# Patient Record
Sex: Female | Born: 1995 | Race: White | Hispanic: No | Marital: Married | Smoking: Never smoker
Health system: Southern US, Community
[De-identification: ages and names within clinical notes are randomized; demographics above are authoritative.]

## PROBLEM LIST (undated history)

## (undated) DIAGNOSIS — K219 Gastro-esophageal reflux disease without esophagitis: Secondary | ICD-10-CM

## (undated) DIAGNOSIS — J45909 Unspecified asthma, uncomplicated: Secondary | ICD-10-CM

## (undated) DIAGNOSIS — F32A Depression, unspecified: Secondary | ICD-10-CM

## (undated) DIAGNOSIS — F419 Anxiety disorder, unspecified: Secondary | ICD-10-CM

## (undated) DIAGNOSIS — R55 Syncope and collapse: Secondary | ICD-10-CM

## (undated) DIAGNOSIS — G90A Postural orthostatic tachycardia syndrome (POTS): Secondary | ICD-10-CM

## (undated) DIAGNOSIS — Z8669 Personal history of other diseases of the nervous system and sense organs: Secondary | ICD-10-CM

## (undated) DIAGNOSIS — I951 Orthostatic hypotension: Secondary | ICD-10-CM

## (undated) DIAGNOSIS — R569 Unspecified convulsions: Secondary | ICD-10-CM

## (undated) DIAGNOSIS — F329 Major depressive disorder, single episode, unspecified: Secondary | ICD-10-CM

## (undated) DIAGNOSIS — I498 Other specified cardiac arrhythmias: Secondary | ICD-10-CM

## (undated) DIAGNOSIS — R000 Tachycardia, unspecified: Secondary | ICD-10-CM

## (undated) HISTORY — DX: Unspecified convulsions: R56.9

## (undated) HISTORY — DX: Depression, unspecified: F32.A

## (undated) HISTORY — DX: Gastro-esophageal reflux disease without esophagitis: K21.9

## (undated) HISTORY — DX: Personal history of other diseases of the nervous system and sense organs: Z86.69

## (undated) HISTORY — PX: WRIST SURGERY: SHX841

## (undated) HISTORY — DX: Major depressive disorder, single episode, unspecified: F32.9

## (undated) HISTORY — DX: Unspecified asthma, uncomplicated: J45.909

---
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
## 2014-11-02 ENCOUNTER — Ambulatory Visit: Payer: Self-pay | Admitting: Emergency Medicine

## 2014-11-21 ENCOUNTER — Emergency Department: Payer: Self-pay | Admitting: Student

## 2015-02-09 ENCOUNTER — Encounter: Payer: Self-pay | Admitting: General Practice

## 2015-02-09 ENCOUNTER — Emergency Department
Admission: EM | Admit: 2015-02-09 | Discharge: 2015-02-09 | Disposition: A | Discharge status: Home / Self Care | Payer: Managed Care, Other (non HMO) | Attending: Emergency Medicine | Admitting: Emergency Medicine

## 2015-02-09 ENCOUNTER — Other Ambulatory Visit: Payer: Self-pay

## 2015-02-09 DIAGNOSIS — R55 Syncope and collapse: Secondary | ICD-10-CM | POA: Insufficient documentation

## 2015-02-09 DIAGNOSIS — R509 Fever, unspecified: Secondary | ICD-10-CM | POA: Insufficient documentation

## 2015-02-09 HISTORY — DX: Syncope and collapse: R55

## 2015-02-09 LAB — URINALYSIS COMPLETE WITH MICROSCOPIC (ARMC ONLY)
Bilirubin Urine: NEGATIVE
GLUCOSE, UA: NEGATIVE mg/dL
HGB URINE DIPSTICK: NEGATIVE
Leukocytes, UA: NEGATIVE
NITRITE: NEGATIVE
PROTEIN: NEGATIVE mg/dL
RBC / HPF: NONE SEEN RBC/hpf (ref 0–5)
SPECIFIC GRAVITY, URINE: 1.006 (ref 1.005–1.030)
pH: 6 (ref 5.0–8.0)

## 2015-02-09 LAB — CBC
HCT: 40.9 % (ref 35.0–47.0)
HEMOGLOBIN: 13.6 g/dL (ref 12.0–16.0)
MCH: 30.6 pg (ref 26.0–34.0)
MCHC: 33.4 g/dL (ref 32.0–36.0)
MCV: 91.8 fL (ref 80.0–100.0)
PLATELETS: 348 10*3/uL (ref 150–440)
RBC: 4.45 MIL/uL (ref 3.80–5.20)
RDW: 12.8 % (ref 11.5–14.5)
WBC: 18.1 10*3/uL — ABNORMAL HIGH (ref 3.6–11.0)

## 2015-02-09 LAB — BASIC METABOLIC PANEL
ANION GAP: 10 (ref 5–15)
BUN: 11 mg/dL (ref 6–20)
CHLORIDE: 99 mmol/L — AB (ref 101–111)
CO2: 18 mmol/L — ABNORMAL LOW (ref 22–32)
Calcium: 8.1 mg/dL — ABNORMAL LOW (ref 8.9–10.3)
Creatinine, Ser: 1.07 mg/dL — ABNORMAL HIGH (ref 0.44–1.00)
GFR calc Af Amer: >60 mL/min (ref >60)
GLUCOSE: 101 mg/dL — AB (ref 65–99)
Potassium: 4.2 mmol/L (ref 3.5–5.1)
SODIUM: 127 mmol/L — AB (ref 135–145)

## 2015-02-09 MED ORDER — SODIUM CHLORIDE 0.9 % IV BOLUS (SEPSIS)
1000.0000 mL | Freq: Once | INTRAVENOUS | Status: AC
  Administered 2015-02-09: 1000 mL via INTRAVENOUS

## 2015-02-09 MED ORDER — IBUPROFEN 800 MG PO TABS
800.0000 mg | ORAL_TABLET | Freq: Once | ORAL | Status: AC
  Administered 2015-02-09: 800 mg via ORAL

## 2015-02-09 MED ORDER — PROPRANOLOL HCL 10 MG PO TABS: 10.0000 mg | ORAL_TABLET | Freq: Three times a day (TID) | ORAL | Status: DC | PRN

## 2015-02-09 MED ORDER — IBUPROFEN 800 MG PO TABS
ORAL_TABLET | ORAL | Status: AC
  Administered 2015-02-09: 800 mg via ORAL
  Filled 2015-02-09: qty 1

## 2015-02-09 MED ORDER — IBUPROFEN 800 MG PO TABS
ORAL_TABLET | ORAL | Status: AC
  Filled 2015-02-09: qty 1

## 2015-02-09 NOTE — ED Notes (Signed)
Patient unable to provide urine specimen at this time. Patient verbalized understanding to attempt urine specimen again.

## 2015-02-09 NOTE — ED Notes (Signed)

## 2015-02-09 NOTE — ED Notes (Signed)
Pt. Arrived to ed from home via ems. Reports that pt experienced syncope episode x two per pt. 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 Pt states "i woke up this morning just not feeling right". Pt reports that she has an apt in 2017 with a neurologist for these symptoms. Pt. Reports everytime she feels as If she is going to experiencing these syncope episode she experiences seizure like activities.  Alert and Oriented x 3 on arrival.

## 2015-02-09 NOTE — ED Notes (Signed)
MD at bedside. 

## 2015-02-09 NOTE — Discharge Instructions (Signed)
Syncope °Syncope means a person passes out (faints). The person usually wakes up in less than 5 minutes. It is important to seek medical care for syncope. °HOME CARE °· Have someone stay with you until you feel normal. °· Do not drive, use machines, or play sports until your doctor says it is okay. °· Keep all doctor visits as told. °· Lie down when you feel like you might pass out. Take deep breaths. Wait until you feel normal before standing up. °· Drink enough fluids to keep your pee (urine) clear or pale yellow. °· If you take blood pressure or heart medicine, get up slowly. Take several minutes to sit and then stand. °GET HELP RIGHT AWAY IF:  °· You have a severe headache. °· You have pain in the chest, belly (abdomen), or back. °· You are bleeding from the mouth or butt (rectum). °· You have black or tarry poop (stool). °· You have an irregular or very fast heartbeat. °· You have pain with breathing. °· You keep passing out, or you have shaking (seizures) when you pass out. °· You pass out when sitting or lying down. °· You feel confused. °· You have trouble walking. °· You have severe weakness. °· You have vision problems. °If you fainted, call your local emergency services (911 in [place] Do not drive yourself to the hospital. °MAKE SURE YOU:  °· Understand these instructions. °· Will watch your condition. °· Will get help right away if you are not doing well or get worse. °Document Released: 02/15/2008 Document Revised: 02/28/2012 Document Reviewed: 10/28/2011 °Exit[employer]® Patient Information ©2015 [employer]. This information is not intended to replace advice given to you by your health care provider. Make sure you discuss any questions you have with your health care provider. ° °

## 2015-02-09 NOTE — ED Provider Notes (Signed)
[facility] Emergency Department Provider Note  Time seen: 6:40 PM  I have reviewed the triage vital signs and the nursing notes.   HISTORY  Chief Complaint Near Syncope and Fever    HPI Sarah Harvey is a 19 y.o. female with no past medical history who presents the emergency department after a syncopal episode. According to the patient she has been feeling "bad" all day today. Her heart races at times. Patient was on the toilet urinating, when she became nauseated, got up and passed out. Patient has had frequent syncopal episodes since 2014 every several months per her parents. She has had a workup by cardiologist including tilt table testing and a seven-day Holter monitor without any abnormal findings. She has an appointment in January with an electrophysiologist for further workup. Today denies any chest pain, shortness of breath. Denies abdominal pain. Denies vaginal bleeding, bloody/black stool.    Past Medical History  Diagnosis Date  . Syncope     There are no active problems to display for this patient.   Past Surgical History  Procedure Laterality Date  . Wrist surgery      No current outpatient prescriptions on file.  Allergies Sulfa antibiotics  No family history on file.  Social History History  Substance Use Topics  . Smoking status: Never Smoker   . Smokeless tobacco: Never Used  . Alcohol Use: No    Review of Systems Constitutional: Negative for fever. Cardiovascular: Negative for chest pain. Respiratory: Negative for shortness of breath. Gastrointestinal: Negative for abdominal pain, vomiting and diarrhea. Positive for nausea. Genitourinary: Negative for dysuria. Neurological: Negative for headaches, focal weakness or numbness.  10-point ROS otherwise negative.  ____________________________________________   PHYSICAL EXAM:  VITAL SIGNS: ED Triage Vitals  Enc Vitals Group     BP 02/09/15 1738 101/50 mmHg    Pulse Rate 02/09/15 1738 110     Resp --      Temp 02/09/15 1738 98.7 F (37.1 C)     Temp Source 02/09/15 1738 Oral     SpO2 02/09/15 1738 100 %     Weight 02/09/15 1738 179 lb (81.194 kg)     Height 02/09/15 1738 5\' 4"  (1.626 m)     Head Cir --      Peak Flow --      Pain Score 02/09/15 1739 4     Pain Loc --      Pain Edu? --      Excl. in [place]? --     Constitutional: Alert and oriented. Well appearing and in no distress. Eyes: Normal exam ENT   Head: Normocephalic and atraumatic.   Mouth/Throat: Dry mucous membranes Cardiovascular: Normal rate, regular rhythm around 100 bpm. Respiratory: Normal respiratory effort without tachypnea nor retractions. Breath sounds are clear Gastrointestinal: Soft and nontender. No distention.   Musculoskeletal: Nontender with normal range of motion in all extremities. No lower extremity tenderness or edema. Neurologic:  Normal speech and language. No gross focal neurologic deficits  Skin:  Skin is warm, dry and intact.  Psychiatric: Mood and affect are normal. Speech and behavior are normal.   ____________________________________________    EKG  EKG reviewed and interpreted by myself shows sinus tachycardia 114 bpm, narrow QRS, normal axis, normal intervals, nonspecific but no concerning ST changes noted.    INITIAL IMPRESSION / ASSESSMENT AND PLAN / ED COURSE  Pertinent labs & imaging results that were available during my care of the patient were reviewed by me and  considered in my medical decision making (see chart for details).  Patient presents for syncopal episode. Patient has had many syncopal episodes in the past several years and has had a workup by a cardiologist. We will check labs, IV hydrate the patient. Patient states she feels her heart racing for several hours before she passes out. I discussed with the patient follow up with cardiology for a 30 day Holter monitor. She states while she was at a seven-day Holter monitor  she did not have any events. I also discussed the possibility of starting propranolol as needed when she feels her heart racing. The patient would like to do this. Currently patient appears very well, no medical complaints at this time besides some generalized weakness.   ----------------------------------------- 8:02 PM on 02/09/2015 -----------------------------------------  Patient's labs show a low sodium level, I discussed this with the patient. The patient states she has been told this by her cardiologist in the past. They recommended that she start drinking Gatorade and eating pretzels. She states she has been drinking free water. Mom states at times she will drink 6 glasses and one sitting.  I discussed this with the patient that she really needs to decrease her free water intake. Patient agreeable. Patient does have a mild fever 100.9. We will treat with ibuprofen. Currently awaiting urinalysis results.    Urinalysis is largely within normal limits besides slight ketones. Fevers come down. Unclear source of fever at this time, the patient denies headache, neck pain. She does state some mild congestion recently. ____________________________________________   FINAL CLINICAL IMPRESSION(S) / ED DIAGNOSES  Syncope Fever  Harvest Dark, MD 02/09/15 [phone]

## 2015-09-28 ENCOUNTER — Emergency Department
Admission: EM | Admit: 2015-09-28 | Discharge: 2015-09-28 | Disposition: A | Discharge status: Home / Self Care | Payer: Managed Care, Other (non HMO) | Attending: Emergency Medicine | Admitting: Emergency Medicine

## 2015-09-28 ENCOUNTER — Encounter: Payer: Self-pay | Admitting: Emergency Medicine

## 2015-09-28 DIAGNOSIS — F419 Anxiety disorder, unspecified: Secondary | ICD-10-CM | POA: Insufficient documentation

## 2015-09-28 DIAGNOSIS — Z3202 Encounter for pregnancy test, result negative: Secondary | ICD-10-CM | POA: Insufficient documentation

## 2015-09-28 DIAGNOSIS — Z79899 Other long term (current) drug therapy: Secondary | ICD-10-CM | POA: Diagnosis not present

## 2015-09-28 DIAGNOSIS — R252 Cramp and spasm: Secondary | ICD-10-CM | POA: Insufficient documentation

## 2015-09-28 DIAGNOSIS — R000 Tachycardia, unspecified: Secondary | ICD-10-CM | POA: Insufficient documentation

## 2015-09-28 DIAGNOSIS — R42 Dizziness and giddiness: Secondary | ICD-10-CM | POA: Insufficient documentation

## 2015-09-28 DIAGNOSIS — R55 Syncope and collapse: Secondary | ICD-10-CM | POA: Diagnosis not present

## 2015-09-28 DIAGNOSIS — R531 Weakness: Secondary | ICD-10-CM | POA: Diagnosis present

## 2015-09-28 LAB — CBC
HCT: 36.6 % (ref 35.0–47.0)
Hemoglobin: 12.3 g/dL (ref 12.0–16.0)
MCH: 30.4 pg (ref 26.0–34.0)
MCHC: 33.6 g/dL (ref 32.0–36.0)
MCV: 90.6 fL (ref 80.0–100.0)
Platelets: 349 10*3/uL (ref 150–440)
RBC: 4.04 MIL/uL (ref 3.80–5.20)
RDW: 12.7 % (ref 11.5–14.5)
WBC: 13.1 10*3/uL — AB (ref 3.6–11.0)

## 2015-09-28 LAB — BASIC METABOLIC PANEL
ANION GAP: 7 (ref 5–15)
BUN: 10 mg/dL (ref 6–20)
CALCIUM: 9 mg/dL (ref 8.9–10.3)
CO2: 25 mmol/L (ref 22–32)
Chloride: 107 mmol/L (ref 101–111)
Creatinine, Ser: 0.98 mg/dL (ref 0.44–1.00)
Glucose, Bld: 113 mg/dL — ABNORMAL HIGH (ref 65–99)
Potassium: 3.5 mmol/L (ref 3.5–5.1)
SODIUM: 139 mmol/L (ref 135–145)

## 2015-09-28 LAB — COMPREHENSIVE METABOLIC PANEL
ALBUMIN: 3.8 g/dL (ref 3.5–5.0)
ALK PHOS: 103 U/L (ref 38–126)
ALT: 16 U/L (ref 14–54)
ANION GAP: 7 (ref 5–15)
AST: 15 U/L (ref 15–41)
BILIRUBIN TOTAL: 0.4 mg/dL (ref 0.3–1.2)
BUN: 10 mg/dL (ref 6–20)
CALCIUM: 8.9 mg/dL (ref 8.9–10.3)
CO2: 25 mmol/L (ref 22–32)
Chloride: 105 mmol/L (ref 101–111)
Creatinine, Ser: 0.94 mg/dL (ref 0.44–1.00)
GFR calc Af Amer: >60 mL/min (ref >60)
GFR calc non Af Amer: >60 mL/min (ref >60)
GLUCOSE: 109 mg/dL — AB (ref 65–99)
Potassium: 3.6 mmol/L (ref 3.5–5.1)
Sodium: 137 mmol/L (ref 135–145)
TOTAL PROTEIN: 7 g/dL (ref 6.5–8.1)

## 2015-09-28 LAB — POCT PREGNANCY, URINE: Preg Test, Ur: NEGATIVE

## 2015-09-28 MED ORDER — ONDANSETRON HCL 4 MG/2ML IJ SOLN
4.0000 mg | Freq: Once | INTRAMUSCULAR | Status: AC
  Administered 2015-09-28: 4 mg via INTRAVENOUS
  Filled 2015-09-28: qty 2

## 2015-09-28 MED ORDER — SODIUM CHLORIDE 0.9 % IV BOLUS (SEPSIS)
1000.0000 mL | Freq: Once | INTRAVENOUS | Status: AC
  Administered 2015-09-28: 1000 mL via INTRAVENOUS

## 2015-09-28 MED ORDER — PENTAFLUOROPROP-TETRAFLUOROETH EX AERO
INHALATION_SPRAY | CUTANEOUS | Status: AC
  Filled 2015-09-28: qty 30

## 2015-09-28 MED ORDER — ONDANSETRON HCL 4 MG PO TABS: 4.0000 mg | ORAL_TABLET | Freq: Every day | ORAL | Status: DC | PRN

## 2015-09-28 NOTE — ED Notes (Addendum)
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

## 2015-09-28 NOTE — ED Provider Notes (Signed)
[facility] Emergency Department Provider Note  ____________________________________________  Time seen: Approximately 7:05 AM  I have reviewed the triage vital signs and the nursing notes.   HISTORY  Chief Complaint Nausea and Weakness    HPI Sarah Harvey is a 20 y.o. female with a history of vasovagal syncope who is presenting today with 3 episodes of near syncope and one episode of syncope. She said that about 1 AM she began having dry heaves followed by lightheadedness and everything going "fuzzy." She says that after the episode she does get cramping in her hands or feet which has happened in the past. She denies any chest pain or shortness of breath at any time. She says that she is also been feeling anxious because she is going back to college and starting volleyball in several days. She says that anxiety has precipitated these episodes in the past. She said that on her way to the exam room this morning she syncopized for about 30 seconds. No seizure activity was noticed at any point. She denies any pain at this time. She says that the preceding symptoms of lightheadedness, heart racing and stiffness in her hands and feet afterwards have all been present in the past. She sees Dr. Saralyn Pilar for cardiology and is scheduled to see an electrophysiologist on the 31st of June of this month. She says she possibly has a sick contact. However, denies diarrhea. Has had dry heaves in the past prior to her simple episodes as well.   Past Medical History  Diagnosis Date  . Syncope     There are no active problems to display for this patient.   Past Surgical History  Procedure Laterality Date  . Wrist surgery      Current Outpatient Rx  Name  Route  Sig  Dispense  Refill  . norethindrone-ethinyl estradiol (JUNEL FE,GILDESS FE,LOESTRIN FE) 1-20 MG-MCG tablet   Oral   Take 1 tablet by mouth daily.         . propranolol (INDERAL) 10 MG tablet   Oral  Take 1 tablet (10 mg total) by mouth every 8 (eight) hours as needed (fast heart rate).   20 tablet   0   . acetaminophen (TYLENOL) 500 MG tablet   Oral   Take 500 mg by mouth daily. Pt took for headache         . cefdinir (OMNICEF) 300 MG capsule   Oral   Take 300 mg by mouth 2 (two) times daily. Pt states took 875 mg twice a day for 7 days           Allergies Sulfa antibiotics  No family history on file.  Social History Social History  Substance Use Topics  . Smoking status: Never Smoker   . Smokeless tobacco: Never Used  . Alcohol Use: No    Review of Systems Constitutional: No fever/chills Eyes: No visual changes. ENT: No sore throat. Cardiovascular: Denies chest pain. Respiratory: Denies shortness of breath. Gastrointestinal: No abdominal pain.   No diarrhea.  No constipation. Genitourinary: Negative for dysuria. Musculoskeletal: Negative for back pain. Skin: Negative for rash. Neurological: Negative for headaches, focal weakness or numbness.  10-point ROS otherwise negative.  ____________________________________________   PHYSICAL EXAM:  VITAL SIGNS: ED Triage Vitals  Enc Vitals Group     BP 09/28/15 0652 116/82 mmHg     Pulse Rate 09/28/15 0652 88     Resp 09/28/15 0652 18     Temp 09/28/15 0652 98.1 F (  36.7 C)     Temp Source 09/28/15 0652 Oral     SpO2 09/28/15 0652 95 %     Weight 09/28/15 0652 170 lb (77.111 kg)     Height 09/28/15 0652 5\' 4"  (1.626 m)     Head Cir --      Peak Flow --      Pain Score 09/28/15 0644 4     Pain Loc --      Pain Edu? --      Excl. in [employer]? --     Constitutional: Alert and oriented. Well appearing and in no acute distress. Eyes: Conjunctivae are normal. PERRL. EOMI. Head: Atraumatic. Nose: No congestion/rhinnorhea. Mouth/Throat: Mucous membranes are moist.   Neck: No stridor.   Cardiovascular: Tachycardic, regular rhythm. Grossly normal heart sounds.  Good peripheral circulation. Respiratory: Normal  respiratory effort.  No retractions. Lungs CTAB. Gastrointestinal: Soft and nontender. No distention. No abdominal bruits. No CVA tenderness. Musculoskeletal: No lower extremity tenderness nor edema.  No joint effusions. Neurologic:  Normal speech and language. No gross focal neurologic deficits are appreciated. No gait instability. Skin:  Skin is warm, dry and intact. No rash noted. Psychiatric: Mood and affect are normal. Speech and behavior are normal.  ____________________________________________   LABS (all labs ordered are listed, but only abnormal results are displayed)  Labs Reviewed  BASIC METABOLIC PANEL - Abnormal; Notable for the following:    Glucose, Bld 113 (*)    All other components within normal limits  CBC - Abnormal; Notable for the following:    WBC 13.1 (*)    All other components within normal limits  COMPREHENSIVE METABOLIC PANEL - Abnormal; Notable for the following:    Glucose, Bld 109 (*)    All other components within normal limits  POC URINE PREG, ED  POCT PREGNANCY, URINE   ____________________________________________  EKG  ED ECG REPORT I, Schaevitz,  Youlanda Roys, the attending physician, personally viewed and interpreted this ECG.   Date: 09/28/2015  EKG Time: 6:51 AM  Rate: 89  Rhythm: normal sinus rhythm  Axis: Normal axis  Intervals:Short PR interval but without any obvious delta waves.  ST&T Change: No ST elevations or depressions. No abnormal T-wave inversions.  Some irregularity in the rhythm but likely respirophasic as the patient is an otherwise healthy 20 year old.  ____________________________________________  RADIOLOGY   ____________________________________________   PROCEDURES   ____________________________________________   INITIAL IMPRESSION / ASSESSMENT AND PLAN / ED COURSE  Pertinent labs & imaging results that were available during my care of the patient were reviewed by me and considered in my medical decision  making (see chart for details).  Episodes consistent with patient's past vasovagal syncope. Very low likelihood for PE. The patient is on birth control pills however she has no chest pain. No shortness of breath. Similar presentation and previous syncopal episodes. Patient also noted that she did not but one meal yesterday so that may be an element of dehydration as well. I will hydrate the patient and check basic labs.  ----------------------------------------- 10:41 AM on 09/28/2015 -----------------------------------------  Patient is resting comfortably at this time without any further episodes of vomiting or near syncope or syncope. Since heart rate is now 96 bpm on the monitor. She feels subjectively better as well. Likely ongoing syncopal episodes which were worsened by her dry heaving as well as anxiety. She has no appointment to follow up with her electrophysiologist later this month. She'll be discharged with Zofran. Encouraged to limit activity for the  rest of the day as well as eat and drink simple foods such as water, chicken broth and toes. The patient as well as the parents understand the plan and are willing to comply.  ____________________________________________   FINAL CLINICAL IMPRESSION(S) / ED DIAGNOSES  Vasovagal syncope.    Orbie Pyo, MD 09/28/15 1052

## 2015-09-28 NOTE — ED Notes (Signed)
IVF started and IV site noted infiltrated. Attempt by myself for new site x 3 without success. Medic request attempt.

## 2015-09-28 NOTE — Discharge Instructions (Signed)

## 2015-09-28 NOTE — ED Notes (Signed)
IV in [facility] due to multiple sticks and it being the only vein accessible.

## 2016-04-19 ENCOUNTER — Emergency Department: Payer: Managed Care, Other (non HMO)

## 2016-04-19 ENCOUNTER — Emergency Department
Admission: EM | Admit: 2016-04-19 | Discharge: 2016-04-20 | Disposition: A | Discharge status: Home / Self Care | Payer: Managed Care, Other (non HMO) | Attending: Emergency Medicine | Admitting: Emergency Medicine

## 2016-04-19 ENCOUNTER — Encounter: Payer: Self-pay | Admitting: Emergency Medicine

## 2016-04-19 DIAGNOSIS — R55 Syncope and collapse: Secondary | ICD-10-CM

## 2016-04-19 DIAGNOSIS — Z79899 Other long term (current) drug therapy: Secondary | ICD-10-CM | POA: Insufficient documentation

## 2016-04-19 DIAGNOSIS — R569 Unspecified convulsions: Secondary | ICD-10-CM | POA: Diagnosis present

## 2016-04-19 LAB — BASIC METABOLIC PANEL
ANION GAP: 8 (ref 5–15)
BUN: 12 mg/dL (ref 6–20)
CHLORIDE: 107 mmol/L (ref 101–111)
CO2: 24 mmol/L (ref 22–32)
Calcium: 9 mg/dL (ref 8.9–10.3)
Creatinine, Ser: 0.93 mg/dL (ref 0.44–1.00)
GFR calc Af Amer: >60 mL/min (ref >60)
GLUCOSE: 126 mg/dL — AB (ref 65–99)
POTASSIUM: 3.8 mmol/L (ref 3.5–5.1)
Sodium: 139 mmol/L (ref 135–145)

## 2016-04-19 LAB — CBC
HCT: 39.5 % (ref 35.0–47.0)
HEMOGLOBIN: 13.6 g/dL (ref 12.0–16.0)
MCH: 31.6 pg (ref 26.0–34.0)
MCHC: 34.5 g/dL (ref 32.0–36.0)
MCV: 91.7 fL (ref 80.0–100.0)
Platelets: 338 10*3/uL (ref 150–440)
RBC: 4.31 MIL/uL (ref 3.80–5.20)
RDW: 12.2 % (ref 11.5–14.5)
WBC: 7.5 10*3/uL (ref 3.6–11.0)

## 2016-04-19 LAB — POCT PREGNANCY, URINE: PREG TEST UR: NEGATIVE

## 2016-04-19 NOTE — ED Provider Notes (Signed)
[facility] Emergency Department Provider Note  Time seen: 9:17 PM  I have reviewed the triage vital signs and the nursing notes.   HISTORY  Chief Complaint Seizures    HPI Sarah Harvey is a 20 y.o. 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 The family states the patient has these episodes, which have been occurring with increasing frequency. They described the episode as the patient feeling like she is going to pass out, she lays on the floor, then starts shaking her head back and forth and putting her arms up in a contracted manner. States they're usually able to talk her through the episodes but tonight they were not able to talk her through the episode. 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 But has never been diagnosed with a seizure disorder. Per mom she is not sure the patient is passing out and jerking where she is having actual seizures. Her doctor has referred her for an EEG later this month to evaluate. At this time the patient's only complaint is a moderate frontal headache. Denies any focal weakness or numbness. Denies any chest pain now or at any time. Denies any lightheadedness currently.  Past Medical History:  Diagnosis Date  . Pott's disease   . Syncope     There are no active problems to display for this patient.   Past Surgical History:  Procedure Laterality Date  . WRIST SURGERY      Prior to Admission medications   Medication Sig Start Date End Date Taking? Authorizing Provider  acetaminophen (TYLENOL) 500 MG tablet Take 500 mg by mouth daily. Pt took for headache    Historical Provider, MD  cefdinir (OMNICEF) 300 MG capsule Take 300 mg by mouth 2 (two) times daily. Pt states took 875 mg twice a day for 7 days    Historical Provider, MD   norethindrone-ethinyl estradiol (JUNEL FE,GILDESS FE,LOESTRIN FE) 1-20 MG-MCG tablet Take 1 tablet by mouth daily.    Historical Provider, MD  ondansetron (ZOFRAN) 4 MG tablet Take 1 tablet (4 mg total) by mouth daily as needed for nausea or vomiting. 09/28/15   Orbie Pyo, MD  propranolol (INDERAL) 10 MG tablet Take 1 tablet (10 mg total) by mouth every 8 (eight) hours as needed (fast heart rate). 02/09/15   Harvest Dark, MD    Allergies  Allergen Reactions  . Sulfa Antibiotics Hives    No family history on file.  Social History Social History  Substance Use Topics  . Smoking status: Never Smoker  . Smokeless tobacco: Never Used  . Alcohol use No    Review of Systems Constitutional: Negative for fever. Cardiovascular: Negative for chest pain. Respiratory: Negative for shortness of breath. Gastrointestinal: Negative for abdominal pain, Genitourinary: Negative for dysuria. Musculoskeletal: Negative for back pain. Neurological: Moderate headache. Denies focal weakness or numbness. 10-point ROS otherwise negative.  ____________________________________________   PHYSICAL EXAM:  VITAL SIGNS: ED Triage Vitals  Enc Vitals Group     BP 04/19/16 2030 (!) 134/93     Pulse Rate 04/19/16 2030 93     Resp 04/19/16 2030 (!) 24     Temp 04/19/16 2016 98.2 F (36.8 C)     Temp Source 04/19/16 2016 Oral     SpO2 04/19/16 2030 99 %     Weight 04/19/16 2016 170 lb (77.1 kg)  Height 04/19/16 2016 5\' 4"  (1.626 m)     Head Circumference --      Peak Flow --      Pain Score 04/19/16 2016 7     Pain Loc --      Pain Edu? --      Excl. in [place]? --     Constitutional: Alert and oriented. Well appearing and in no distress. Eyes: Normal exam ENT   Head: Normocephalic and atraumatic   Mouth/Throat: Mucous membranes are moist. Cardiovascular: Normal rate, regular rhythm. No murmur Respiratory: Normal respiratory effort without tachypnea nor retractions. Breath  sounds are clear  Gastrointestinal: Soft and nontender. No distention.   Musculoskeletal: Nontender with normal range of motion in all extremities. Neurologic:  Normal speech and language. No gross focal neurologic deficits. Equal grip strength. 5/5 motor in all extremities. No pronator drift. Skin:  Skin is warm, dry and intact.  Psychiatric: Mood and affect are normal. Speech and behavior are normal.   ____________________________________________    EKG  EKG reviewed and interpreted by myself shows normal sinus rhythm at 93 bpm, narrow QRS, normal axis, normal intervals, no ST changes, normal EKG.  ____________________________________________    RADIOLOGY  CT head shows no acute abnormality.  ____________________________________________   INITIAL IMPRESSION / ASSESSMENT AND PLAN / ED COURSE  Pertinent labs & imaging results that were available during my care of the patient were reviewed by me and considered in my medical decision making (see chart for details).  The patient presents emergency Department seizure-like activity. They state his activity has been occurring at increasing frequency since January. Patient's symptoms appeared to be most consistent with syncope/near-syncope with hypoxic jerking. We will check labs, obtain a CT scan the head as the patient states a moderate headache with seizure-like activity. Patient has follow-up with her electrophysiologist, has an EEG scheduled for later this month as well as a tilt table test. We will IV hydrate well in the emergency department. Overall the patient appears very well with a normal neurologic exam.   CT head shows no acute abnormality, labs are largely within normal limits. BMP was faxed manually, is within normal limits. We'll discharge the patient home with PCP follow-up. I discussed strict return precautions, plenty of oral hydration and plenty of rest. ____________________________________________   FINAL CLINICAL  IMPRESSION(S) / ED DIAGNOSES  Syncope    Harvest Dark, MD 04/19/16 2307

## 2016-04-19 NOTE — ED Triage Notes (Signed)
Per [facility] patient comes from home. Hx of Potts disease. Patient had two seizures back to back. Patients neurologist is at [facility]. He has prescribed her salt packets to take. Patient had two today.  Patient normally has seizures when she is dehydrated. Per family, patient coughs a lot during seizures. Patient states this has been going on for 3 years. 1st seizure lasted 1 minute. 4 minutes afterwards patient had another seizure. EMS states it took the patient about 30 minutes to return to baseline. Now, patient c/o generalized weakness, nausea and HA. VSS. EMS gave 4 of Zofran.

## 2016-04-20 LAB — URINALYSIS COMPLETE WITH MICROSCOPIC (ARMC ONLY)
Bilirubin Urine: NEGATIVE
Glucose, UA: NEGATIVE mg/dL
KETONES UR: NEGATIVE mg/dL
Nitrite: NEGATIVE
PH: 8 (ref 5.0–8.0)
PROTEIN: NEGATIVE mg/dL
RBC / HPF: NONE SEEN RBC/hpf (ref 0–5)
Specific Gravity, Urine: 1.003 — ABNORMAL LOW (ref 1.005–1.030)

## 2016-05-29 ENCOUNTER — Emergency Department (HOSPITAL_COMMUNITY)
Admission: EM | Admit: 2016-05-29 | Discharge: 2016-05-29 | Disposition: A | Discharge status: Home / Self Care | Payer: Managed Care, Other (non HMO) | Attending: Emergency Medicine | Admitting: Emergency Medicine

## 2016-05-29 ENCOUNTER — Encounter (HOSPITAL_COMMUNITY): Payer: Self-pay | Admitting: Emergency Medicine

## 2016-05-29 DIAGNOSIS — I498 Other specified cardiac arrhythmias: Secondary | ICD-10-CM | POA: Insufficient documentation

## 2016-05-29 DIAGNOSIS — G90A Postural orthostatic tachycardia syndrome (POTS): Secondary | ICD-10-CM

## 2016-05-29 DIAGNOSIS — Z79899 Other long term (current) drug therapy: Secondary | ICD-10-CM | POA: Diagnosis not present

## 2016-05-29 DIAGNOSIS — L219 Seborrheic dermatitis, unspecified: Secondary | ICD-10-CM | POA: Diagnosis not present

## 2016-05-29 DIAGNOSIS — R000 Tachycardia, unspecified: Secondary | ICD-10-CM

## 2016-05-29 DIAGNOSIS — I951 Orthostatic hypotension: Secondary | ICD-10-CM

## 2016-05-29 DIAGNOSIS — R569 Unspecified convulsions: Secondary | ICD-10-CM | POA: Diagnosis present

## 2016-05-29 HISTORY — DX: Postural orthostatic tachycardia syndrome (POTS): G90.A

## 2016-05-29 HISTORY — DX: Tachycardia, unspecified: R00.0

## 2016-05-29 HISTORY — DX: Orthostatic hypotension: I95.1

## 2016-05-29 HISTORY — DX: Other specified cardiac arrhythmias: I49.8

## 2016-05-29 LAB — RAPID URINE DRUG SCREEN, HOSP PERFORMED
AMPHETAMINES: NOT DETECTED
BARBITURATES: NOT DETECTED
Benzodiazepines: POSITIVE — AB
Cocaine: NOT DETECTED
OPIATES: NOT DETECTED
TETRAHYDROCANNABINOL: NOT DETECTED

## 2016-05-29 LAB — URINALYSIS, ROUTINE W REFLEX MICROSCOPIC
BILIRUBIN URINE: NEGATIVE
GLUCOSE, UA: NEGATIVE mg/dL
Hgb urine dipstick: NEGATIVE
Ketones, ur: 15 mg/dL — AB
NITRITE: NEGATIVE
PH: 7.5 (ref 5.0–8.0)
Protein, ur: NEGATIVE mg/dL
SPECIFIC GRAVITY, URINE: 1.008 (ref 1.005–1.030)

## 2016-05-29 LAB — CBC WITH DIFFERENTIAL/PLATELET
Basophils Absolute: 0 10*3/uL (ref 0.0–0.1)
Basophils Relative: 0 %
EOS ABS: 0 10*3/uL (ref 0.0–0.7)
Eosinophils Relative: 0 %
HEMATOCRIT: 41.6 % (ref 36.0–46.0)
HEMOGLOBIN: 14 g/dL (ref 12.0–15.0)
LYMPHS ABS: 1.3 10*3/uL (ref 0.7–4.0)
LYMPHS PCT: 15 %
MCH: 31.1 pg (ref 26.0–34.0)
MCHC: 33.7 g/dL (ref 30.0–36.0)
MCV: 92.4 fL (ref 78.0–100.0)
MONOS PCT: 6 %
Monocytes Absolute: 0.5 10*3/uL (ref 0.1–1.0)
NEUTROS ABS: 7.3 10*3/uL (ref 1.7–7.7)
NEUTROS PCT: 79 %
Platelets: 411 10*3/uL — ABNORMAL HIGH (ref 150–400)
RBC: 4.5 MIL/uL (ref 3.87–5.11)
RDW: 12.5 % (ref 11.5–15.5)
WBC: 9.1 10*3/uL (ref 4.0–10.5)

## 2016-05-29 LAB — BASIC METABOLIC PANEL
ANION GAP: 4 — AB (ref 5–15)
BUN: 7 mg/dL (ref 6–20)
CHLORIDE: 111 mmol/L (ref 101–111)
CO2: 23 mmol/L (ref 22–32)
CREATININE: 0.76 mg/dL (ref 0.44–1.00)
Calcium: 9 mg/dL (ref 8.9–10.3)
GFR calc non Af Amer: >60 mL/min (ref >60)
Glucose, Bld: 98 mg/dL (ref 65–99)
POTASSIUM: 3.8 mmol/L (ref 3.5–5.1)
SODIUM: 138 mmol/L (ref 135–145)

## 2016-05-29 LAB — URINE MICROSCOPIC-ADD ON

## 2016-05-29 LAB — POC URINE PREG, ED: Preg Test, Ur: NEGATIVE

## 2016-05-29 MED ORDER — BUTALBITAL-APAP-CAFFEINE 50-325-40 MG PO TABS: 1.0000 | ORAL_TABLET | Freq: Four times a day (QID) | ORAL | 0 refills | Status: AC | PRN

## 2016-05-29 MED ORDER — TRIAMCINOLONE ACETONIDE 0.1 % EX CREA: 1.0000 "application " | TOPICAL_CREAM | Freq: Two times a day (BID) | CUTANEOUS | 0 refills | Status: DC

## 2016-05-29 MED ORDER — SODIUM CHLORIDE 0.9 % IV BOLUS (SEPSIS)
1000.0000 mL | Freq: Once | INTRAVENOUS | Status: AC
  Administered 2016-05-29: 1000 mL via INTRAVENOUS

## 2016-05-29 MED ORDER — KETOROLAC TROMETHAMINE 30 MG/ML IJ SOLN
30.0000 mg | Freq: Once | INTRAMUSCULAR | Status: AC
  Administered 2016-05-29: 30 mg via INTRAVENOUS
  Filled 2016-05-29: qty 1

## 2016-05-29 MED ORDER — IBUPROFEN 800 MG PO TABS: 800.0000 mg | ORAL_TABLET | Freq: Three times a day (TID) | ORAL | 0 refills | Status: DC
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
## 2016-05-29 NOTE — Discharge Instructions (Signed)
There were no significant abnormalities on your labs today. Follow-up with Dr. Sabra Heck, the electrophysiologist, as soon as possible. Ibuprofen or naproxen as needed for headaches. Should these medications fail to resolve the headache, try the Fioricet. Return to the ED as needed.  For the seborrheic dermatitis: Use the triamcinolone cream twice a day as needed to the affected areas.

## 2016-05-29 NOTE — ED Notes (Signed)
Care handoff to Kim RN 

## 2016-05-29 NOTE — Progress Notes (Signed)
CSW provided the pt and family with emotional support. CSW spoke with pt about her goals and things she wants to accomplish in life. CSW provided and discussed with pt resources on individual therapists in the area, [place] support group information, and [employer]. CSW encouraged the pt to also reach out to the counseling center on her college campus. Pt and family were happy to receive resources.  Lendon Collar, [place] ED Clinical Social Worker [phone]

## 2016-05-29 NOTE — ED Notes (Signed)
Lying  B/P 113/70 P. 82 Sitting  B/P 123/110 P 84 Standing  B/P 117/78  P 85

## 2016-05-29 NOTE — ED Notes (Signed)
Pt ambulatory to bathroom at this time.

## 2016-05-29 NOTE — ED Triage Notes (Signed)
PT brought to ED by EMS for seizure-like activity. PT has been diagnosed with POTS. PT takes salt supplements throughout the day. PT's mother reports they have been told they are not seizures, but POTS "episodes." PT reports a typical episode lasts 10 minutes. PT reports someone told her that this episode lasted 18 minutes prior to EMS arriving. EMS gave 2.5mg  of versed and it broke the activity.

## 2016-05-29 NOTE — ED Notes (Signed)
Sprite and graham crackers given to pt

## 2017-12-13 ENCOUNTER — Ambulatory Visit
Admission: EM | Admit: 2017-12-13 | Discharge: 2017-12-13 | Disposition: A | Discharge status: Home / Self Care | Payer: Commercial Managed Care - PPO | Attending: Family Medicine | Admitting: Family Medicine

## 2017-12-13 ENCOUNTER — Encounter: Payer: Self-pay | Admitting: Emergency Medicine

## 2017-12-13 DIAGNOSIS — J0100 Acute maxillary sinusitis, unspecified: Secondary | ICD-10-CM | POA: Diagnosis not present

## 2017-12-13 DIAGNOSIS — R05 Cough: Secondary | ICD-10-CM

## 2017-12-13 DIAGNOSIS — R059 Cough, unspecified: Secondary | ICD-10-CM

## 2017-12-13 MED ORDER — BENZONATATE 100 MG PO CAPS: 100.0000 mg | ORAL_CAPSULE | Freq: Three times a day (TID) | ORAL | 0 refills | Status: DC | PRN

## 2017-12-13 MED ORDER — ALBUTEROL SULFATE HFA 108 (90 BASE) MCG/ACT IN AERS: 2.0000 | INHALATION_SPRAY | RESPIRATORY_TRACT | 0 refills | Status: DC | PRN

## 2017-12-13 MED ORDER — AMOXICILLIN-POT CLAVULANATE 875-125 MG PO TABS: 1.0000 | ORAL_TABLET | Freq: Two times a day (BID) | ORAL | 0 refills | Status: DC

## 2017-12-13 NOTE — ED Provider Notes (Addendum)
[facility] ____________________________________________  Time seen: Approximately 12:18 PM  I have reviewed the triage vital signs and the nursing notes.   HISTORY  Chief Complaint Cough (APPOINTMENT)   HPI Sarah Harvey is a 22 y.o. female   present with mother at bedside for evaluation of 3 weeks of cough and congestion symptoms.  States initially felt like it was just a cold, reporting multiple others in household with similar complaints, however hers has lingered.  States over the last 1 week she has had gradual increased sinus pressure and sinus congestion with continued coughing.  States last week she did have a fever, subjective, not measured.  Denies any fever or fever-like symptoms in the last few days.  States cough is overall a hacking nonproductive cough.  States began developing green thick nasal drainage with onset of sinus pressure.  Reports continues to eat and drink well.  Unrelieved with multiple over-the-counter congestion medications including TheraFlu.  States that she does pay attention particularly to what she takes over-the-counter due to pots syndrome.  Denies any issues regarding her pots syndrome or syncope.  Reports otherwise feels well.  Denies other aggravating factors. Denies chest pain, shortness of breath, abdominal pain, extremity pain, extremity swelling or rash. Denies recent sickness. Denies recent antibiotic use.    Tresea Mall, MD: PCP  Patient's last menstrual period was 11/22/2017 (approximate).Denies pregnancy.   Past Medical History:  Diagnosis Date  . POTS (postural orthostatic tachycardia syndrome)   . Syncope     There are no active problems to display for this patient.   Past Surgical History:  Procedure Laterality Date  . WRIST SURGERY       No current facility-administered medications for this encounter.   Current Outpatient Medications:  .  cetirizine (ZYRTEC ALLERGY) 10 MG tablet, Take 10 mg by mouth  daily., Disp: , Rfl:  .  norethindrone-ethinyl estradiol (JUNEL FE,GILDESS FE,LOESTRIN FE) 1-20 MG-MCG tablet, Take 1 tablet by mouth daily., Disp: , Rfl:  .  acetaminophen (TYLENOL) 500 MG tablet, Take 500 mg by mouth daily. Pt took for headache, Disp: , Rfl:  .  albuterol (PROVENTIL HFA;VENTOLIN HFA) 108 (90 Base) MCG/ACT inhaler, Inhale 2 puffs into the lungs every 4 (four) hours as needed for wheezing., Disp: 1 Inhaler, Rfl: 0 .  amoxicillin-clavulanate (AUGMENTIN) 875-125 MG tablet, Take 1 tablet by mouth every 12 (twelve) hours., Disp: 20 tablet, Rfl: 0 .  benzonatate (TESSALON PERLES) 100 MG capsule, Take 1 capsule (100 mg total) by mouth 3 (three) times daily as needed for cough., Disp: 15 capsule, Rfl: 0 .  ibuprofen (ADVIL,MOTRIN) 800 MG tablet, Take 1 tablet (800 mg total) by mouth 3 (three) times daily., Disp: 21 tablet, Rfl: 0 .  ondansetron (ZOFRAN) 4 MG tablet, Take 1 tablet (4 mg total) by mouth daily as needed for nausea or vomiting., Disp: 10 tablet, Rfl: 0  Allergies Sulfa antibiotics  History reviewed. No pertinent family history.  Social History Social History   Tobacco Use  . Smoking status: Never Smoker  . Smokeless tobacco: Never Used  Substance Use Topics  . Alcohol use: Yes    Comment: every now and then  . Drug use: No    Review of Systems Constitutional: As above.  ENT: No sore throat. Cardiovascular: Denies chest pain. Respiratory: Denies shortness of breath. Gastrointestinal: No abdominal pain.  Musculoskeletal: Negative for back pain. Skin: Negative for rash.   ____________________________________________   PHYSICAL EXAM:  VITAL SIGNS: ED Triage Vitals  Enc  Vitals Group     BP 12/13/17 1210 90/69     Pulse Rate 12/13/17 1210 89     Resp 12/13/17 1210 16     Temp 12/13/17 1210 98.2 F (36.8 C)     Temp Source 12/13/17 1210 Oral     SpO2 12/13/17 1210 98 %     Weight 12/13/17 1207 180 lb (81.6 kg)     Height 12/13/17 1207 5\' 3"  (1.6 m)       Head Circumference --      Peak Flow --      Pain Score 12/13/17 1207 7     Pain Loc --      Pain Edu? --      Excl. in [place]? --    Constitutional: Alert and oriented. Well appearing and in no acute distress. Eyes: Conjunctivae are normal.  Head: Atraumatic.Mild to moderate tenderness to palpation bilateral frontal and maxillary sinuses. No swelling. No erythema.   Ears: no erythema, normal TMs bilaterally.   Nose: nasal congestion with bilateral nasal turbinate erythema and edema.   Mouth/Throat: Mucous membranes are moist.  Oropharynx non-erythematous.No tonsillar swelling or exudate.  Neck: No stridor.  No cervical spine tenderness to palpation. Hematological/Lymphatic/Immunilogical: No cervical lymphadenopathy. Cardiovascular: Normal rate, regular rhythm. Grossly normal heart sounds.  Good peripheral circulation. Respiratory: Normal respiratory effort.  No retractions. No wheezes, rales or rhonchi. Good air movement. Dry intermittent cough noted in room with mild bronchospasm.  Speaks in complete sentences. Musculoskeletal: No cervical, thoracic or lumbar tenderness to palpation.  Neurologic:  Normal speech and language. No gait instability. Skin:  Skin is warm, dry and intact. No rash noted.  Psychiatric: Mood and affect are normal. Speech and behavior are normal.  ___________________________________________   LABS (all labs ordered are listed, but only abnormal results are displayed)  Labs Reviewed - No data to display   PROCEDURES Procedures    INITIAL IMPRESSION / ASSESSMENT AND PLAN / ED COURSE  Pertinent labs & imaging results that were available during my care of the patient were reviewed by me and considered in my medical decision making (see chart for details).  Well-appearing patient.  No acute distress.  Suspect recent viral upper respiratory infection, secondary sinusitis.  Will treat patient with oral Augmentin and PRN Tessalon Perles, albuterol inhaler as  needed.  Encourage rest, fluids, supportive care.  Discussed follow up with Primary care physician this week. Discussed follow up and return parameters including no resolution or any worsening concerns. Patient verbalized understanding and agreed to plan.   ____________________________________________   FINAL CLINICAL IMPRESSION(S) / ED DIAGNOSES  Final diagnoses:  Acute maxillary sinusitis, recurrence not specified  Cough     ED Discharge Orders        Ordered    amoxicillin-clavulanate (AUGMENTIN) 875-125 MG tablet  Every 12 hours     12/13/17 1224    benzonatate (TESSALON PERLES) 100 MG capsule  3 times daily PRN     12/13/17 1224    albuterol (PROVENTIL HFA;VENTOLIN HFA) 108 (90 Base) MCG/ACT inhaler  Every 4 hours PRN     12/13/17 1224       Note: This dictation was prepared with Dragon dictation along with smaller phrase technology. Any transcriptional errors that result from this process are unintentional.         Marylene Land, NP 12/13/17 [place], [place], NP 12/13/17 1650

## 2017-12-13 NOTE — ED Triage Notes (Signed)
Patient c/o cough and chest congestion for 3 weeks.   

## 2017-12-13 NOTE — Discharge Instructions (Addendum)
Take medication as prescribed. Rest. Drink plenty of fluids.  ° °Follow up with your primary care physician this week as needed. Return to Urgent care for new or worsening concerns.  ° °

## 2018-02-16 ENCOUNTER — Ambulatory Visit: Payer: Managed Care, Other (non HMO) | Admitting: Internal Medicine

## 2018-03-20 ENCOUNTER — Ambulatory Visit: Payer: Commercial Managed Care - PPO | Admitting: Internal Medicine

## 2018-05-10 ENCOUNTER — Telehealth: Payer: Self-pay

## 2018-05-10 NOTE — Telephone Encounter (Signed)
Copied from [place] [id number]. Topic: Inquiry >> May 10, 2018 11:41 AM Conception Chancy, NT wrote: Reason for CRM: patient mother is calling her name is Sarah Harvey, she states that all her family sees Dr. Nicki Reaper and Dr. Nicki Reaper said that she would accept her daughter as a new patient. Please confirm and contact to schedule. It is okay to contact the mother to schedule since she is at college.

## 2018-05-10 NOTE — Telephone Encounter (Signed)
msg sent to provider 

## 2018-05-15 ENCOUNTER — Telehealth: Payer: Self-pay

## 2018-05-15 NOTE — Telephone Encounter (Signed)
Copied from [place] [phone]. Topic: Inquiry >> May 10, 2018 11:41 AM Conception Chancy, NT wrote: Reason for CRM: patient mother is calling her name is Sarah Harvey, she states that all her family sees Dr. Nicki Reaper and Dr. Nicki Reaper said that she would accept her daughter as a new patient. Please confirm and contact to schedule. It is okay to contact the mother to schedule since she is at college. >> May 15, 2018 12:47 PM Sheran Luz wrote: Pt's mother called back inquiring about if Dr Nicki Reaper would accept her daughter and when they could get that scheduled.

## 2018-05-15 NOTE — Telephone Encounter (Signed)
Lm to call back  and schedule

## 2018-07-13 ENCOUNTER — Ambulatory Visit
Admission: EM | Admit: 2018-07-13 | Discharge: 2018-07-13 | Disposition: A | Discharge status: Home / Self Care | Payer: Commercial Managed Care - PPO | Attending: Family Medicine | Admitting: Family Medicine

## 2018-07-13 DIAGNOSIS — R197 Diarrhea, unspecified: Secondary | ICD-10-CM

## 2018-07-13 DIAGNOSIS — R110 Nausea: Secondary | ICD-10-CM

## 2018-07-13 DIAGNOSIS — R509 Fever, unspecified: Secondary | ICD-10-CM | POA: Diagnosis not present

## 2018-07-13 DIAGNOSIS — J029 Acute pharyngitis, unspecified: Secondary | ICD-10-CM | POA: Diagnosis not present

## 2018-07-13 DIAGNOSIS — J020 Streptococcal pharyngitis: Secondary | ICD-10-CM | POA: Diagnosis not present

## 2018-07-13 LAB — RAPID INFLUENZA A&B ANTIGENS
Influenza A (ARMC): NEGATIVE
Influenza B (ARMC): NEGATIVE

## 2018-07-13 LAB — RAPID STREP SCREEN (MED CTR MEBANE ONLY): STREPTOCOCCUS, GROUP A SCREEN (DIRECT): POSITIVE — AB

## 2018-07-13 MED ORDER — PENICILLIN V POTASSIUM 500 MG PO TABS: 500.0000 mg | ORAL_TABLET | Freq: Three times a day (TID) | ORAL | 0 refills | Status: DC

## 2018-07-13 MED ORDER — ACETAMINOPHEN 500 MG PO TABS
1000.0000 mg | ORAL_TABLET | Freq: Once | ORAL | Status: AC
  Administered 2018-07-13: 1000 mg via ORAL

## 2018-07-13 MED ORDER — LIDOCAINE VISCOUS HCL 2 % MT SOLN: OROMUCOSAL | 0 refills | Status: DC

## 2018-07-13 MED ORDER — ONDANSETRON 8 MG PO TBDP: 8.0000 mg | ORAL_TABLET | Freq: Three times a day (TID) | ORAL | 0 refills | Status: DC | PRN

## 2018-07-13 NOTE — ED Notes (Signed)
Pt to room via [place].  HR in 130s.  Pt with red, inflamed throat.  No known sick contacts.

## 2018-07-13 NOTE — ED Notes (Signed)
Pt in treatment room.  In NAD.  Needs address.  Pt/Fam updated on POC.   

## 2018-07-13 NOTE — ED Provider Notes (Signed)
[facility]    CSN: [id number] Arrival date & time: 07/13/18  0834     History   Chief Complaint Chief Complaint  Patient presents with  . Fever  . Sore Throat    HPI Sarah Harvey is a 22 y.o. female.   The history is provided by the patient.  Fever  Temp source:  Subjective Severity:  Moderate Onset quality:  Sudden Duration:  5 days Timing:  Constant Chronicity:  New Associated symptoms: congestion, diarrhea (loose stools), nausea, rhinorrhea and sore throat   Associated symptoms: no chest pain and no headaches   Risk factors: sick contacts   Risk factors: no contaminated food, no contaminated water, no immunosuppression, no occupational exposure and no recent travel   Sore Throat  This is a new problem. The current episode started more than 2 days ago. The problem occurs constantly. The problem has been gradually worsening. Pertinent negatives include no chest pain, no abdominal pain, no headaches and no shortness of breath. She has tried nothing for the symptoms.    Past Medical History:  Diagnosis Date  . POTS (postural orthostatic tachycardia syndrome)   . Syncope     There are no active problems to display for this patient.   Past Surgical History:  Procedure Laterality Date  . WRIST SURGERY      OB History   None      Home Medications    Prior to Admission medications   Medication Sig Start Date End Date Taking? Authorizing Provider  acetaminophen (TYLENOL) 500 MG tablet Take 500 mg by mouth daily. Pt took for headache   Yes [provider]  norethindrone-ethinyl estradiol (JUNEL FE,GILDESS FE,LOESTRIN FE) 1-20 MG-MCG tablet Take 1 tablet by mouth daily.   Yes [provider]  albuterol (PROVENTIL HFA;VENTOLIN HFA) 108 (90 Base) MCG/ACT inhaler Inhale 2 puffs into the lungs every 4 (four) hours as needed for wheezing. 12/13/17   Marylene Land, NP  amoxicillin-clavulanate (AUGMENTIN) 875-125 MG tablet Take 1  tablet by mouth every 12 (twelve) hours. 12/13/17   Marylene Land, NP  benzonatate (TESSALON PERLES) 100 MG capsule Take 1 capsule (100 mg total) by mouth 3 (three) times daily as needed for cough. 12/13/17   Marylene Land, NP  cetirizine (ZYRTEC ALLERGY) 10 MG tablet Take 10 mg by mouth daily.    [provider]  ibuprofen (ADVIL,MOTRIN) 800 MG tablet Take 1 tablet (800 mg total) by mouth 3 (three) times daily. 05/29/16   Joy, Shawn C, PA-C  lidocaine (XYLOCAINE) 2 % solution 20 ml gargle and spit q 6 hours prn 07/13/18   Norval Gable, MD  ondansetron (ZOFRAN ODT) 8 MG disintegrating tablet Take 1 tablet (8 mg total) by mouth every 8 (eight) hours as needed. 07/13/18   Norval Gable, MD  ondansetron (ZOFRAN) 4 MG tablet Take 1 tablet (4 mg total) by mouth daily as needed for nausea or vomiting. 09/28/15   Schaevitz, Randall An, MD  penicillin v potassium (VEETID) 500 MG tablet Take 1 tablet (500 mg total) by mouth 3 (three) times daily. 07/13/18   Norval Gable, MD    Family History History reviewed. No pertinent family history.  Social History Social History   Tobacco Use  . Smoking status: Never Smoker  . Smokeless tobacco: Never Used  Substance Use Topics  . Alcohol use: Yes    Comment: every now and then  . Drug use: No     Allergies   Sulfa antibiotics   Review  of Systems Review of Systems  Constitutional: Positive for fever.  HENT: Positive for congestion, rhinorrhea and sore throat.   Respiratory: Negative for shortness of breath.   Cardiovascular: Negative for chest pain.  Gastrointestinal: Positive for diarrhea (loose stools) and nausea. Negative for abdominal pain.  Neurological: Negative for headaches.     Physical Exam Triage Vital Signs ED Triage Vitals  Enc Vitals Group     BP 07/13/18 0849 (!) 133/94     Pulse Rate 07/13/18 0849 (!) 132     Resp 07/13/18 0849 18     Temp 07/13/18 0849 (!) 100.5 F (38.1 C)     Temp Source 07/13/18 0849 Oral      SpO2 07/13/18 0849 99 %     Weight --      Height --      Head Circumference --      Peak Flow --      Pain Score 07/13/18 0851 9     Pain Loc --      Pain Edu? --      Excl. in [place]? --    No data found.  Updated Vital Signs BP (!) 133/94 (BP Location: Right Arm)   Pulse (!) 132   Temp (!) 100.5 F (38.1 C) (Oral)   Resp 18   LMP 06/20/2018   SpO2 99%   Visual Acuity Right Eye Distance:   Left Eye Distance:   Bilateral Distance:    Right Eye Near:   Left Eye Near:    Bilateral Near:     Physical Exam  Constitutional: She appears well-developed and well-nourished. No distress.  HENT:  Head: Normocephalic and atraumatic.  Nose: Mucosal edema and rhinorrhea present. No nose lacerations, sinus tenderness, nasal deformity, septal deviation or nasal septal hematoma. No epistaxis.  No foreign bodies. Right sinus exhibits no maxillary sinus tenderness and no frontal sinus tenderness. Left sinus exhibits no maxillary sinus tenderness and no frontal sinus tenderness.  Mouth/Throat: Uvula is midline and mucous membranes are normal. Posterior oropharyngeal erythema present. No oropharyngeal exudate, posterior oropharyngeal edema or tonsillar abscesses. Tonsillar exudate.  Eyes: Pupils are equal, round, and reactive to light. Conjunctivae and EOM are normal. Right eye exhibits no discharge. Left eye exhibits no discharge. No scleral icterus.  Neck: Normal range of motion. Neck supple. No thyromegaly present.  Cardiovascular: Normal rate, regular rhythm and normal heart sounds.  Pulmonary/Chest: Effort normal and breath sounds normal. No respiratory distress. She has no wheezes. She has no rales.  Lymphadenopathy:    She has no cervical adenopathy.  Skin: She is not diaphoretic.  Nursing note and vitals reviewed.    UC Treatments / Results  Labs (all labs ordered are listed, but only abnormal results are displayed) Labs Reviewed  RAPID STREP SCREEN ([facility] ONLY) -  Abnormal; Notable for the following components:      Result Value   Streptococcus, Group A Screen (Direct) POSITIVE (*)    All other components within normal limits  RAPID INFLUENZA A&B ANTIGENS ([facility] ONLY)    EKG None  Radiology No results found.  Procedures Procedures (including critical care time)  Medications Ordered in UC Medications  acetaminophen (TYLENOL) tablet 1,000 mg (1,000 mg Oral Given 07/13/18 0921)    Initial Impression / Assessment and Plan / UC Course  I have reviewed the triage vital signs and the nursing notes.  Pertinent labs & imaging results that were available during my care of the patient were reviewed by me and  considered in my medical decision making (see chart for details).      Final Clinical Impressions(s) / UC Diagnoses   Final diagnoses:  Streptococcal sore throat    ED Prescriptions    Medication Sig Dispense Auth. Provider   ondansetron (ZOFRAN ODT) 8 MG disintegrating tablet Take 1 tablet (8 mg total) by mouth every 8 (eight) hours as needed. 10 tablet Norval Gable, MD   penicillin v potassium (VEETID) 500 MG tablet Take 1 tablet (500 mg total) by mouth 3 (three) times daily. 30 tablet [place]ia Stanger, Linward Foster, MD   lidocaine (XYLOCAINE) 2 % solution 20 ml gargle and spit q 6 hours prn 100 mL Norval Gable, MD     1. Lab results and diagnosis reviewed with patient 2. rx as per orders above; reviewed possible side effects, interactions, risks and benefits  3. Recommend supportive treatment with rest, fluids, otc analgesics prn 4. Follow-up prn if symptoms worsen or don't improve   Controlled Substance Prescriptions [place] Controlled Substance Registry consulted? Not Applicable   Norval Gable, MD 07/13/18 1230

## 2018-07-13 NOTE — ED Triage Notes (Signed)
Sore throat and fever for a week.  Emesis and diarrhea "just started".  Pt reporting POTS.  WC to room.

## 2018-08-15 ENCOUNTER — Ambulatory Visit: Payer: Commercial Managed Care - PPO | Admitting: Internal Medicine

## 2018-08-28 ENCOUNTER — Telehealth: Payer: Self-pay

## 2018-08-28 NOTE — Telephone Encounter (Signed)
Copied from [place] [id number]. Topic: Appointment Scheduling - Scheduling Inquiry for Clinic >> Aug 28, 2018 12:33 PM Cecelia Byars, [place] wrote: Reason for CRM Patients mom called to reschedule her appointment with please call her at  Dr Nicki Reaper   please call her at [phone]

## 2018-08-30 NOTE — Telephone Encounter (Signed)
New pt appt rescheduled and advised to arrive 30 minutes early for appt with paper work completed

## 2018-12-11 ENCOUNTER — Other Ambulatory Visit: Payer: Self-pay

## 2018-12-11 ENCOUNTER — Ambulatory Visit: Payer: Commercial Managed Care - PPO | Admitting: Internal Medicine

## 2018-12-11 ENCOUNTER — Encounter: Payer: Self-pay | Admitting: Internal Medicine

## 2018-12-11 DIAGNOSIS — R5383 Other fatigue: Secondary | ICD-10-CM | POA: Insufficient documentation

## 2018-12-11 DIAGNOSIS — F32A Depression, unspecified: Secondary | ICD-10-CM

## 2018-12-11 DIAGNOSIS — Z8669 Personal history of other diseases of the nervous system and sense organs: Secondary | ICD-10-CM | POA: Diagnosis not present

## 2018-12-11 DIAGNOSIS — J45909 Unspecified asthma, uncomplicated: Secondary | ICD-10-CM | POA: Diagnosis not present

## 2018-12-11 DIAGNOSIS — R000 Tachycardia, unspecified: Secondary | ICD-10-CM | POA: Diagnosis not present

## 2018-12-11 DIAGNOSIS — G90A Postural orthostatic tachycardia syndrome (POTS): Secondary | ICD-10-CM

## 2018-12-11 DIAGNOSIS — K219 Gastro-esophageal reflux disease without esophagitis: Secondary | ICD-10-CM

## 2018-12-11 DIAGNOSIS — I951 Orthostatic hypotension: Secondary | ICD-10-CM

## 2018-12-11 DIAGNOSIS — F320 Major depressive disorder, single episode, mild: Secondary | ICD-10-CM

## 2018-12-11 LAB — HEPATIC FUNCTION PANEL
ALBUMIN: 3.8 g/dL (ref 3.5–5.2)
ALK PHOS: 105 U/L (ref 39–117)
ALT: 25 U/L (ref 0–35)
AST: 19 U/L (ref 0–37)
BILIRUBIN TOTAL: 0.3 mg/dL (ref 0.2–1.2)
Bilirubin, Direct: 0.1 mg/dL (ref 0.0–0.3)
Total Protein: 6.8 g/dL (ref 6.0–8.3)

## 2018-12-11 LAB — CBC WITH DIFFERENTIAL/PLATELET
BASOS ABS: 0 10*3/uL (ref 0.0–0.1)
Basophils Relative: 0.6 % (ref 0.0–3.0)
Eosinophils Absolute: 0.1 10*3/uL (ref 0.0–0.7)
Eosinophils Relative: 0.9 % (ref 0.0–5.0)
HCT: 38.6 % (ref 36.0–46.0)
Hemoglobin: 13.3 g/dL (ref 12.0–15.0)
LYMPHS ABS: 2.1 10*3/uL (ref 0.7–4.0)
Lymphocytes Relative: 28.5 % (ref 12.0–46.0)
MCHC: 34.4 g/dL (ref 30.0–36.0)
MCV: 89.7 fl (ref 78.0–100.0)
MONO ABS: 0.7 10*3/uL (ref 0.1–1.0)
MONOS PCT: 9.1 % (ref 3.0–12.0)
NEUTROS ABS: 4.4 10*3/uL (ref 1.4–7.7)
Neutrophils Relative %: 60.9 % (ref 43.0–77.0)
PLATELETS: 406 10*3/uL — AB (ref 150.0–400.0)
RBC: 4.31 Mil/uL (ref 3.87–5.11)
RDW: 13.5 % (ref 11.5–15.5)
WBC: 7.2 10*3/uL (ref 4.0–10.5)

## 2018-12-11 LAB — TSH: TSH: 1.18 u[IU]/mL (ref 0.35–4.50)

## 2018-12-11 LAB — BASIC METABOLIC PANEL
BUN: 10 mg/dL (ref 6–23)
CHLORIDE: 103 meq/L (ref 96–112)
CO2: 28 meq/L (ref 19–32)
Calcium: 9.1 mg/dL (ref 8.4–10.5)
Creatinine, Ser: 0.8 mg/dL (ref 0.40–1.20)
GFR: 88.29 mL/min (ref >60.00)
Glucose, Bld: 93 mg/dL (ref 70–99)
POTASSIUM: 4.2 meq/L (ref 3.5–5.1)
Sodium: 137 mEq/L (ref 135–145)

## 2018-12-11 MED ORDER — SERTRALINE HCL 100 MG PO TABS: 100.0000 mg | ORAL_TABLET | Freq: Every day | ORAL | 3 refills | Status: DC

## 2018-12-11 NOTE — Progress Notes (Signed)
Patient ID: Sarah Harvey, female   DOB: [birth year], 23 y.o.   MRN: [id number]   Subjective:    Patient ID: Sarah Harvey, female    DOB: 04/08/1996, 23 y.o.   MRN: [id number]  HPI  Patient here for to establish care.  She is accompanied by her mother.  History obtained from both of them.  She has been followed by Dr Jaynie Crumble [facility].  She has been diagnosed with POTS.  Has seen cardiology - Dr Marlynn Perking.  Has not seen her for two years.  Request to f/u and get established with Dr Esaw Grandchild.  Was diagnosed in 2014.  Doing well now.  No recent episodes.  Has a history of three concussions.  Occurred while playing volleyball.  Had extensive w/up previously, including heart monitor, tilt table test head scan, etc.  Has a history of migraine headaches.  She describes her headache as frontal.  Occurs in the am - after awakening.  Takes ibuprofen.  No significant headaches now.  Previous history of depression.  On zoloft and has done well on zoloft.  Some acid reflux.  No chest pain.  No sob.  No abdominal pain.  Bowels moving.  Off OCPs since 10/2018.  Overall she feels she is doing well.     Past Medical History:  Diagnosis Date  . Asthma   . Depression   . GERD (gastroesophageal reflux disease)   . History of migraine   . POTS (postural orthostatic tachycardia syndrome)   . Seizures ([place])   . Syncope    Past Surgical History:  Procedure Laterality Date  . WRIST SURGERY     Family History  Problem Relation Age of Onset  . Arthritis Mother   . Anxiety disorder Mother   . Miscarriages / [place] Mother   . Alcohol abuse Father   . Depression Father   . Drug abuse Father   . Diabetes Father   . Hyperlipidemia Father   . Hypertension Father   . Arthritis Maternal Grandmother   . Depression Maternal Grandmother   . Hyperlipidemia Maternal Grandmother   . Hypertension Maternal Grandmother   . Learning disabilities Maternal Grandmother   . Cancer Maternal  Grandfather   . Heart disease Maternal Grandfather   . Hyperlipidemia Maternal Grandfather   . Cancer Paternal Grandfather    Social History   Socioeconomic History  . Marital status: Single    Spouse name: Not on file  . Number of children: Not on file  . Years of education: Not on file  . Highest education level: Not on file  Occupational History  . Not on file  Social Needs  . Financial resource strain: Not on file  . Food insecurity:    Worry: Not on file    Inability: Not on file  . Transportation needs:    Medical: Not on file    Non-medical: Not on file  Tobacco Use  . Smoking status: Never Smoker  . Smokeless tobacco: Never Used  Substance and Sexual Activity  . Alcohol use: Yes    Comment: every now and then  . Drug use: No  . Sexual activity: Yes    Birth control/protection: Condom  Lifestyle  . Physical activity:    Days per week: Not on file    Minutes per session: Not on file  . Stress: Not on file  Relationships  . Social connections:    Talks on phone: Not on file    Gets together: Not  on file    Attends religious service: Not on file    Active member of club or organization: Not on file    Attends meetings of clubs or organizations: Not on file    Relationship status: Not on file  Other Topics Concern  . Not on file  Social History Narrative  . Not on file    Outpatient Encounter Medications as of 12/11/2018  Medication Sig  . albuterol (PROVENTIL HFA;VENTOLIN HFA) 108 (90 Base) MCG/ACT inhaler Inhale 2 puffs into the lungs every 4 (four) hours as needed for wheezing.  Marland Kitchen ibuprofen (ADVIL,MOTRIN) 800 MG tablet Take 1 tablet (800 mg total) by mouth 3 (three) times daily.  . [DISCONTINUED] acetaminophen (TYLENOL) 500 MG tablet Take 500 mg by mouth daily. Pt took for headache  . [DISCONTINUED] amoxicillin-clavulanate (AUGMENTIN) 875-125 MG tablet Take 1 tablet by mouth every 12 (twelve) hours.  . [DISCONTINUED] benzonatate (TESSALON PERLES) 100 MG  capsule Take 1 capsule (100 mg total) by mouth 3 (three) times daily as needed for cough.  . [DISCONTINUED] cetirizine (ZYRTEC ALLERGY) 10 MG tablet Take 10 mg by mouth daily.  . [DISCONTINUED] lidocaine (XYLOCAINE) 2 % solution 20 ml gargle and spit q 6 hours prn  . [DISCONTINUED] ondansetron (ZOFRAN ODT) 8 MG disintegrating tablet Take 1 tablet (8 mg total) by mouth every 8 (eight) hours as needed.  . [DISCONTINUED] ondansetron (ZOFRAN) 4 MG tablet Take 1 tablet (4 mg total) by mouth daily as needed for nausea or vomiting.  . [DISCONTINUED] penicillin v potassium (VEETID) 500 MG tablet Take 1 tablet (500 mg total) by mouth 3 (three) times daily.  . sertraline (ZOLOFT) 100 MG tablet Take 1 tablet (100 mg total) by mouth daily.  . [DISCONTINUED] norethindrone-ethinyl estradiol (JUNEL FE,GILDESS FE,LOESTRIN FE) 1-20 MG-MCG tablet Take 1 tablet by mouth daily.   No facility-administered encounter medications on file as of 12/11/2018.     Review of Systems  Constitutional: Negative for appetite change and unexpected weight change.  HENT: Negative for congestion and sinus pressure.   Respiratory: Negative for cough, chest tightness and shortness of breath.   Cardiovascular: Negative for chest pain, palpitations and leg swelling.  Gastrointestinal: Negative for abdominal pain, diarrhea, nausea and vomiting.  Genitourinary: Negative for difficulty urinating and dysuria.  Musculoskeletal: Negative for joint swelling and myalgias.  Skin: Negative for color change and rash.  Neurological: Negative for dizziness.       No significant headaches now.   Psychiatric/Behavioral: Negative for agitation and dysphoric mood.       Objective:    Physical Exam Constitutional:      General: She is not in acute distress.    Appearance: Normal appearance.  HENT:     Nose: Nose normal. No congestion.     Mouth/Throat:     Pharynx: No oropharyngeal exudate or posterior oropharyngeal erythema.  Neck:      Musculoskeletal: Neck supple. No muscular tenderness.     Thyroid: No thyromegaly.  Cardiovascular:     Rate and Rhythm: Normal rate and regular rhythm.  Pulmonary:     Effort: No respiratory distress.     Breath sounds: Normal breath sounds. No wheezing.  Abdominal:     General: Bowel sounds are normal.     Palpations: Abdomen is soft.     Tenderness: There is no abdominal tenderness.  Musculoskeletal:        General: No swelling or tenderness.  Lymphadenopathy:     Cervical: No cervical adenopathy.  Skin:  Findings: No erythema or rash.  Neurological:     Mental Status: She is alert.  Psychiatric:        Mood and Affect: Mood normal.        Behavior: Behavior normal.     BP 120/70   Pulse 75   Temp 98 F (36.7 C) (Oral)   Ht 5\' 4"  (1.626 m)   Wt 249 lb (112.9 kg)   LMP 10/15/2018 (Approximate)   SpO2 98%   BMI 42.74 kg/m  Wt Readings from Last 3 Encounters:  12/11/18 249 lb (112.9 kg)  12/13/17 180 lb (81.6 kg)  05/29/16 170 lb (77.1 kg)     Lab Results  Component Value Date   WBC 7.2 12/11/2018   HGB 13.3 12/11/2018   HCT 38.6 12/11/2018   PLT 406.0 (H) 12/11/2018   GLUCOSE 93 12/11/2018   ALT 25 12/11/2018   AST 19 12/11/2018   NA 137 12/11/2018   K 4.2 12/11/2018   CL 103 12/11/2018   CREATININE 0.80 12/11/2018   BUN 10 12/11/2018   CO2 28 12/11/2018   TSH 1.18 12/11/2018       Assessment & Plan:   Problem List Items Addressed This Visit    Asthma    Breathing stable.  No problems now with asthma.       Fatigue    Does report some fatigue.  Check cbc, tsh and metabolic panel.        Relevant Orders   CBC with Differential/Platelet (Completed)   Hepatic function panel (Completed)   TSH (Completed)   Basic metabolic panel (Completed)   GERD (gastroesophageal reflux disease)    Acid reflux as outlined.  pepcid as directed.  Follow.       History of migraine    Not a significant issue now.  Follow.       Mild depression ([place])     On zoloft.  Doing well on this medication.  Follow.        Relevant Medications   sertraline (ZOLOFT) 100 MG tablet   POTS (postural orthostatic tachycardia syndrome)    Overall doing better.  Request f/u with Dr Esaw Grandchild.  Looked for contact information.  Message sent to get more information to schedule an appt.            Einar Pheasant, MD

## 2018-12-12 ENCOUNTER — Telehealth: Payer: Self-pay

## 2018-12-12 NOTE — Telephone Encounter (Signed)
Unable to leave message for patient

## 2018-12-12 NOTE — Telephone Encounter (Signed)
She does not have to sign form I have already filled this out.

## 2018-12-12 NOTE — Telephone Encounter (Signed)
Copied from [place] [id number]. Topic: General - Other >> Dec 12, 2018 11:01 AM Antonieta Iba C wrote: Reason for CRM: pt called in to have a blank Medical release form mailed out to her at the address on file. Pt says that she was seen yesterday and forgot to get a form to complete before she left. Pt will mail back to office.

## 2018-12-13 ENCOUNTER — Telehealth: Payer: Self-pay

## 2018-12-13 NOTE — Telephone Encounter (Signed)
Pt left vm requesting a refill of her sertraline and she states she has contact her pharmacy and they informed her to contact her office. According to epic this was refilled on 12/11/18. If someone could contact pt or pt's pharmacy

## 2018-12-14 NOTE — Telephone Encounter (Signed)
Called pt. Mail box full. Unable to leave message. Rx was sent to [facility] in [place] for 3 months

## 2018-12-15 ENCOUNTER — Encounter: Payer: Self-pay | Admitting: Internal Medicine

## 2018-12-15 DIAGNOSIS — K219 Gastro-esophageal reflux disease without esophagitis: Secondary | ICD-10-CM | POA: Insufficient documentation

## 2018-12-15 DIAGNOSIS — F32A Depression, unspecified: Secondary | ICD-10-CM | POA: Insufficient documentation

## 2018-12-15 DIAGNOSIS — G90A Postural orthostatic tachycardia syndrome (POTS): Secondary | ICD-10-CM | POA: Insufficient documentation

## 2018-12-15 DIAGNOSIS — Z8669 Personal history of other diseases of the nervous system and sense organs: Secondary | ICD-10-CM | POA: Insufficient documentation

## 2018-12-15 DIAGNOSIS — J45909 Unspecified asthma, uncomplicated: Secondary | ICD-10-CM | POA: Insufficient documentation

## 2018-12-15 DIAGNOSIS — I951 Orthostatic hypotension: Secondary | ICD-10-CM

## 2018-12-15 DIAGNOSIS — F320 Major depressive disorder, single episode, mild: Secondary | ICD-10-CM | POA: Insufficient documentation

## 2018-12-15 DIAGNOSIS — R000 Tachycardia, unspecified: Secondary | ICD-10-CM

## 2018-12-15 NOTE — Assessment & Plan Note (Signed)
On zoloft.  Doing well on this medication.  Follow.

## 2018-12-15 NOTE — Assessment & Plan Note (Signed)
Does report some fatigue.  Check cbc, tsh and metabolic panel.

## 2018-12-15 NOTE — Assessment & Plan Note (Signed)
Not a significant issue now.  Follow.

## 2018-12-15 NOTE — Assessment & Plan Note (Signed)
Breathing stable.  No problems now with asthma.

## 2018-12-15 NOTE — Assessment & Plan Note (Addendum)
Overall doing better.  Request f/u with Dr Esaw Grandchild.  Looked for contact information.  Message sent to get more information to schedule an appt.

## 2018-12-15 NOTE — Assessment & Plan Note (Signed)
Acid reflux as outlined.  pepcid as directed.  Follow.

## 2018-12-18 ENCOUNTER — Telehealth: Payer: Self-pay | Admitting: Internal Medicine

## 2018-12-18 NOTE — Telephone Encounter (Signed)
-----   Message from Lars Masson, LPN sent at 7/0/1100  2:27 PM EDT ----- Regarding: RE: cardiology appt Pt is going to send me a my chart message with his contact information ----- Message ----- From: Einar Pheasant, MD Sent: 12/15/2018   7:34 PM EDT To: Lars Masson, LPN Subject: cardiology appt                                Please call and notify that I am unable to find Dr Esaw Grandchild.  She was wanting an appt scheduled with him to f/u for POTS.  Notify them that I need his contact information and we can schedule an appt.    Thanks.  Dr Nicki Reaper

## 2019-04-23 ENCOUNTER — Ambulatory Visit: Payer: Commercial Managed Care - PPO | Admitting: Internal Medicine

## 2019-05-14 ENCOUNTER — Other Ambulatory Visit: Payer: Self-pay | Admitting: Internal Medicine

## 2019-05-14 MED ORDER — SERTRALINE HCL 100 MG PO TABS: 100.0000 mg | ORAL_TABLET | Freq: Every day | ORAL | 0 refills | Status: DC

## 2019-05-14 NOTE — Telephone Encounter (Signed)
Medication Refill - Medication:  sertraline (ZOLOFT) 100 MG tablet  Has the patient contacted their pharmacy?  Yes advised to call PCP.  Preferred Pharmacy (with phone number or street name):  [id number] - [place], [place] [phone] (Phone) [phone] (Fax)   Agent: Please be advised that RX refills may take up to 3 business days. We ask that you follow-up with your pharmacy.

## 2019-05-29 ENCOUNTER — Other Ambulatory Visit: Payer: Self-pay

## 2019-05-29 ENCOUNTER — Ambulatory Visit: Payer: Commercial Managed Care - PPO | Admitting: Internal Medicine

## 2019-05-29 ENCOUNTER — Encounter: Payer: Self-pay | Admitting: Internal Medicine

## 2019-05-29 VITALS — BP 120/78 | HR 100 | Temp 98.2°F | Resp 16 | Wt 237.0 lb

## 2019-05-29 DIAGNOSIS — R000 Tachycardia, unspecified: Secondary | ICD-10-CM

## 2019-05-29 DIAGNOSIS — J45909 Unspecified asthma, uncomplicated: Secondary | ICD-10-CM | POA: Diagnosis not present

## 2019-05-29 DIAGNOSIS — I951 Orthostatic hypotension: Secondary | ICD-10-CM

## 2019-05-29 DIAGNOSIS — Z23 Encounter for immunization: Secondary | ICD-10-CM

## 2019-05-29 DIAGNOSIS — F32A Depression, unspecified: Secondary | ICD-10-CM

## 2019-05-29 DIAGNOSIS — G90A Postural orthostatic tachycardia syndrome (POTS): Secondary | ICD-10-CM

## 2019-05-29 DIAGNOSIS — D473 Essential (hemorrhagic) thrombocythemia: Secondary | ICD-10-CM | POA: Diagnosis not present

## 2019-05-29 DIAGNOSIS — Z13220 Encounter for screening for lipoid disorders: Secondary | ICD-10-CM | POA: Diagnosis not present

## 2019-05-29 DIAGNOSIS — D75839 Thrombocytosis, unspecified: Secondary | ICD-10-CM | POA: Insufficient documentation

## 2019-05-29 DIAGNOSIS — F320 Major depressive disorder, single episode, mild: Secondary | ICD-10-CM

## 2019-05-29 LAB — CBC WITH DIFFERENTIAL/PLATELET
Basophils Absolute: 0 10*3/uL (ref 0.0–0.1)
Basophils Relative: 1 % (ref 0.0–3.0)
Eosinophils Absolute: 0.1 10*3/uL (ref 0.0–0.7)
Eosinophils Relative: 1.3 % (ref 0.0–5.0)
HCT: 40.2 % (ref 36.0–46.0)
Hemoglobin: 13.6 g/dL (ref 12.0–15.0)
Lymphocytes Relative: 29.8 % (ref 12.0–46.0)
Lymphs Abs: 1.5 10*3/uL (ref 0.7–4.0)
MCHC: 33.7 g/dL (ref 30.0–36.0)
MCV: 93 fl (ref 78.0–100.0)
Monocytes Absolute: 0.3 10*3/uL (ref 0.1–1.0)
Monocytes Relative: 6.5 % (ref 3.0–12.0)
Neutro Abs: 3 10*3/uL (ref 1.4–7.7)
Neutrophils Relative %: 61.4 % (ref 43.0–77.0)
Platelets: 406 10*3/uL — ABNORMAL HIGH (ref 150.0–400.0)
RBC: 4.32 Mil/uL (ref 3.87–5.11)
RDW: 13.4 % (ref 11.5–15.5)
WBC: 4.9 10*3/uL (ref 4.0–10.5)

## 2019-05-29 LAB — LIPID PANEL
Cholesterol: 120 mg/dL (ref 0–200)
HDL: 43.9 mg/dL (ref >39.00)
LDL Cholesterol: 50 mg/dL (ref 0–99)
NonHDL: 76.37
Total CHOL/HDL Ratio: 3
Triglycerides: 133 mg/dL (ref 0.0–149.0)
VLDL: 26.6 mg/dL (ref 0.0–40.0)

## 2019-05-29 NOTE — Progress Notes (Signed)
Patient ID: Sarah Harvey, female   DOB: 1996-08-11, 23 y.o.   MRN: [id number]   Subjective:    Patient ID: Sarah Harvey, female    DOB: [birth year], 23 y.o.   MRN: [id number]  HPI  Patient here for a scheduled follow up.  She reports she is doing relatively well.  Some increased stress with her boyfriends previous relationship.  He has a child.  Some increased stress with this, but overall she feels she is handling things relatively well.  On zoloft. Did discuss counseling.  She is in agreement.  Names given.  Discussed diet and exercise.  No chest pain.  No sob.  No acid reflux.  No abdominal pain.  Bowels moving.  Sees Dr Glennon Mac at [facility] for her breasts, pelvic and pap smears.  States is up to date. Is sexually active.  Uses condoms.      Past Medical History:  Diagnosis Date  . Asthma   . Depression   . GERD (gastroesophageal reflux disease)   . History of migraine   . POTS (postural orthostatic tachycardia syndrome)   . Seizures ([place])   . Syncope    Past Surgical History:  Procedure Laterality Date  . WRIST SURGERY     Family History  Problem Relation Age of Onset  . Arthritis Mother   . Anxiety disorder Mother   . Miscarriages / [place] Mother   . Alcohol abuse Father   . Depression Father   . Drug abuse Father   . Diabetes Father   . Hyperlipidemia Father   . Hypertension Father   . Arthritis Maternal Grandmother   . Depression Maternal Grandmother   . Hyperlipidemia Maternal Grandmother   . Hypertension Maternal Grandmother   . Learning disabilities Maternal Grandmother   . Cancer Maternal Grandfather   . Heart disease Maternal Grandfather   . Hyperlipidemia Maternal Grandfather   . Cancer Paternal Grandfather    Social History   Socioeconomic History  . Marital status: Single    Spouse name: Not on file  . Number of children: Not on file  . Years of education: Not on file  . Highest education level: Not on file  Occupational History   . Not on file  Social Needs  . Financial resource strain: Not on file  . Food insecurity    Worry: Not on file    Inability: Not on file  . Transportation needs    Medical: Not on file    Non-medical: Not on file  Tobacco Use  . Smoking status: Never Smoker  . Smokeless tobacco: Never Used  Substance and Sexual Activity  . Alcohol use: Yes    Comment: every now and then  . Drug use: No  . Sexual activity: Yes    Birth control/protection: Condom  Lifestyle  . Physical activity    Days per week: Not on file    Minutes per session: Not on file  . Stress: Not on file  Relationships  . Social Herbalist on phone: Not on file    Gets together: Not on file    Attends religious service: Not on file    Active member of club or organization: Not on file    Attends meetings of clubs or organizations: Not on file    Relationship status: Not on file  Other Topics Concern  . Not on file  Social History Narrative  . Not on file    Outpatient Encounter Medications as of 05/29/2019  Medication Sig  . albuterol (PROVENTIL HFA;VENTOLIN HFA) 108 (90 Base) MCG/ACT inhaler Inhale 2 puffs into the lungs every 4 (four) hours as needed for wheezing.  Marland Kitchen ibuprofen (ADVIL,MOTRIN) 800 MG tablet Take 1 tablet (800 mg total) by mouth 3 (three) times daily.  . sertraline (ZOLOFT) 100 MG tablet Take 1 tablet (100 mg total) by mouth daily.   No facility-administered encounter medications on file as of 05/29/2019.     Review of Systems  Constitutional: Negative for appetite change and unexpected weight change.  HENT: Negative for congestion and sinus pressure.   Respiratory: Negative for cough, chest tightness and shortness of breath.   Cardiovascular: Negative for chest pain, palpitations and leg swelling.  Gastrointestinal: Negative for abdominal pain, diarrhea, nausea and vomiting.  Genitourinary: Negative for difficulty urinating and dysuria.  Musculoskeletal: Negative for joint  swelling and myalgias.  Skin: Negative for color change and rash.  Neurological: Negative for dizziness, light-headedness and headaches.  Psychiatric/Behavioral: Negative for agitation and dysphoric mood.       Objective:    Physical Exam Constitutional:      General: She is not in acute distress.    Appearance: Normal appearance.  HENT:     Right Ear: External ear normal.     Left Ear: External ear normal.  Eyes:     General: No scleral icterus.       Right eye: No discharge.        Left eye: No discharge.     Conjunctiva/sclera: Conjunctivae normal.  Neck:     Musculoskeletal: Neck supple. No muscular tenderness.     Thyroid: No thyromegaly.  Cardiovascular:     Rate and Rhythm: Normal rate and regular rhythm.  Pulmonary:     Effort: No respiratory distress.     Breath sounds: Normal breath sounds. No wheezing.  Abdominal:     General: Bowel sounds are normal.     Palpations: Abdomen is soft.     Tenderness: There is no abdominal tenderness.  Musculoskeletal:        General: No swelling or tenderness.  Lymphadenopathy:     Cervical: No cervical adenopathy.  Skin:    Findings: No erythema or rash.  Neurological:     Mental Status: She is alert.  Psychiatric:        Mood and Affect: Mood normal.        Behavior: Behavior normal.     BP 120/78   Pulse 100   Temp 98.2 F (36.8 C)   Resp 16   Wt 237 lb (107.5 kg)   SpO2 98%   BMI 40.68 kg/m  Wt Readings from Last 3 Encounters:  05/29/19 237 lb (107.5 kg)  12/11/18 249 lb (112.9 kg)  12/13/17 180 lb (81.6 kg)     Lab Results  Component Value Date   WBC 4.9 05/29/2019   HGB 13.6 05/29/2019   HCT 40.2 05/29/2019   PLT 406.0 (H) 05/29/2019   GLUCOSE 93 12/11/2018   CHOL 120 05/29/2019   TRIG 133.0 05/29/2019   HDL 43.90 05/29/2019   LDLCALC 50 05/29/2019   ALT 25 12/11/2018   AST 19 12/11/2018   NA 137 12/11/2018   K 4.2 12/11/2018   CL 103 12/11/2018   CREATININE 0.80 12/11/2018   BUN 10  12/11/2018   CO2 28 12/11/2018   TSH 1.18 12/11/2018       Assessment & Plan:   Problem List Items Addressed This Visit    Asthma  Breathing stable.        Mild depression ([place])    On zoloft.  Increased stress as outlined. Discussed with her today.  Overall she feels she is handling things relatively well.  Gave her name of counselors.  Follow.        POTS (postural orthostatic tachycardia syndrome)    Overall appears to be stable.  Doing well.  Request referral to Dr Pedro Earls [phone].        Relevant Orders   Ambulatory referral to Cardiology   Thrombocytosis ([place])    Recheck cbc today.        Relevant Orders   CBC with Differential/Platelet (Completed)    Other Visit Diagnoses    Screening cholesterol level    -  Primary   Relevant Orders   Lipid panel (Completed)   Need for immunization against influenza       Relevant Orders   Flu Vaccine QUAD 36+ mos IM (Completed)       Einar Pheasant, MD

## 2019-05-30 DIAGNOSIS — Z23 Encounter for immunization: Secondary | ICD-10-CM

## 2019-06-02 ENCOUNTER — Encounter: Payer: Self-pay | Admitting: Internal Medicine

## 2019-06-02 NOTE — Assessment & Plan Note (Signed)
Overall appears to be stable.  Doing well.  Request referral to Dr Pedro Earls [phone].

## 2019-06-02 NOTE — Assessment & Plan Note (Signed)
On zoloft.  Increased stress as outlined. Discussed with her today.  Overall she feels she is handling things relatively well.  Gave her name of counselors.  Follow.

## 2019-06-02 NOTE — Assessment & Plan Note (Signed)
Breathing stable.

## 2019-06-02 NOTE — Assessment & Plan Note (Signed)
Recheck cbc today.   

## 2019-06-21 ENCOUNTER — Other Ambulatory Visit: Payer: Self-pay | Admitting: Internal Medicine

## 2019-07-08 ENCOUNTER — Ambulatory Visit: Payer: Self-pay | Admitting: Obstetrics and Gynecology

## 2019-07-15 ENCOUNTER — Telehealth: Payer: Self-pay | Admitting: Internal Medicine

## 2019-07-15 NOTE — Telephone Encounter (Signed)
Patient is calling to see if Dr. Nicki Reaper can write a prescription for her for her birth control. Patient has appt on 07/25/2019. But she has finals that week.  Will an appt be needed for the patient.  Please advise   Medication- Tri Lo marzia   Preferred [place].  Please advise Cb- [phone] mother cell - Marta Antu

## 2019-07-16 NOTE — Telephone Encounter (Signed)
The appt 07/25/19 is with her gyn.  If has been taking regularly and no concern regarding pregnancy - ok to refill.  Will need to either reschedule her appt with gyn or get her pelvic/pap smears here (if we are to continue to prescribe).

## 2019-07-16 NOTE — Telephone Encounter (Signed)
Pt is not due until March for her cpe. Just seen her in sept. Are you ok to take over filling her birth control?

## 2019-07-17 ENCOUNTER — Other Ambulatory Visit: Payer: Self-pay

## 2019-07-17 MED ORDER — NORGESTIM-ETH ESTRAD TRIPHASIC 0.18/0.215/0.25 MG-25 MCG PO TABS: 1.0000 | ORAL_TABLET | Freq: Every day | ORAL | 0 refills | Status: DC

## 2019-07-17 NOTE — Telephone Encounter (Signed)
Sent in 1 package for her, she has f/u with gyn so she just needed enough to get her through to her appt. No concerns of pregnancy- confirmed with pt.

## 2019-07-25 ENCOUNTER — Ambulatory Visit: Payer: Self-pay | Admitting: Obstetrics and Gynecology

## 2019-08-09 ENCOUNTER — Other Ambulatory Visit: Payer: Self-pay | Admitting: Internal Medicine

## 2019-08-16 ENCOUNTER — Ambulatory Visit: Payer: Self-pay | Admitting: Obstetrics and Gynecology

## 2019-09-02 ENCOUNTER — Ambulatory Visit: Payer: Self-pay | Admitting: Obstetrics and Gynecology

## 2019-10-03 ENCOUNTER — Telehealth: Payer: Self-pay | Admitting: Internal Medicine

## 2019-10-03 NOTE — Telephone Encounter (Signed)
Pt called needing a copy of her immunizations that she's had for school. Please send to email [email or web address]  Pt also needs a TB test by next Friday. Please advise and thank you!  Call pt @ [phone].

## 2019-10-04 NOTE — Telephone Encounter (Signed)
Pt screened and scheduled for nurse visit. Advised I would print her immunizations. She is bringing 2 forms that Dr Nicki Reaper needs to sign. Advised that virtual visit may be required to complete. Pt was agreeable if needed. Will look at forms when she comes in.

## 2019-10-08 ENCOUNTER — Ambulatory Visit (INDEPENDENT_AMBULATORY_CARE_PROVIDER_SITE_OTHER): Payer: Commercial Managed Care - PPO

## 2019-10-08 ENCOUNTER — Other Ambulatory Visit: Payer: Self-pay

## 2019-10-08 DIAGNOSIS — Z111 Encounter for screening for respiratory tuberculosis: Secondary | ICD-10-CM

## 2019-10-08 NOTE — Progress Notes (Addendum)
Sherin Quarry presents today for injection per MD orders. PPD injection  administered SQ in left fore Arm. Administration without incident. Patient tolerated well. Weldon Nouri,cma   Reviewed.  PPD placed.   Dr Nicki Reaper

## 2019-10-10 ENCOUNTER — Other Ambulatory Visit: Payer: Self-pay

## 2019-10-10 ENCOUNTER — Ambulatory Visit: Payer: Commercial Managed Care - PPO

## 2019-10-10 DIAGNOSIS — Z111 Encounter for screening for respiratory tuberculosis: Secondary | ICD-10-CM

## 2019-10-10 LAB — TB SKIN TEST
Induration: 0 mm
TB Skin Test: NEGATIVE

## 2019-10-10 NOTE — Progress Notes (Signed)
Pt presented today for a PPD. Negative, no induration.

## 2019-10-31 ENCOUNTER — Other Ambulatory Visit: Payer: Self-pay | Admitting: Internal Medicine

## 2019-11-11 ENCOUNTER — Ambulatory Visit: Payer: Commercial Managed Care - PPO | Admitting: Obstetrics and Gynecology

## 2019-11-28 ENCOUNTER — Ambulatory Visit: Payer: Commercial Managed Care - PPO | Admitting: Internal Medicine

## 2019-11-28 DIAGNOSIS — Z0289 Encounter for other administrative examinations: Secondary | ICD-10-CM

## 2019-12-30 ENCOUNTER — Ambulatory Visit: Payer: Commercial Managed Care - PPO | Attending: Internal Medicine

## 2019-12-30 DIAGNOSIS — Z23 Encounter for immunization: Secondary | ICD-10-CM

## 2019-12-30 NOTE — Progress Notes (Signed)
   Covid-19 Vaccination Clinic  Name:  Sarah Harvey    MRN: [id number] DOB: [birth year]  12/30/2019  [name] was observed post Covid-19 immunization for 15 minutes without incident. She was provided with Vaccine Information Sheet and instruction to access the V-Safe system.   [name] was instructed to call 911 with any severe reactions post vaccine: Marland Kitchen Difficulty breathing  . Swelling of face and throat  . A fast heartbeat  . A bad rash all over body  . Dizziness and weakness   Immunizations Administered    Name Date Dose VIS Date Route   Pfizer COVID-19 Vaccine 12/30/2019 11:49 AM 0.3 mL 11/06/2018 Intramuscular   Manufacturer: [employer], [employer]   Lot: [id number]   [place]: [id number]

## 2019-12-31 ENCOUNTER — Other Ambulatory Visit: Payer: Self-pay | Admitting: Internal Medicine

## 2020-01-20 ENCOUNTER — Other Ambulatory Visit: Payer: Self-pay | Admitting: Internal Medicine

## 2020-01-21 ENCOUNTER — Ambulatory Visit: Payer: Commercial Managed Care - PPO

## 2020-04-07 ENCOUNTER — Other Ambulatory Visit: Payer: Self-pay | Admitting: Internal Medicine

## 2020-05-19 ENCOUNTER — Other Ambulatory Visit: Payer: Self-pay | Admitting: Internal Medicine

## 2020-06-30 ENCOUNTER — Other Ambulatory Visit: Payer: Self-pay | Admitting: Internal Medicine

## 2020-09-06 ENCOUNTER — Other Ambulatory Visit: Payer: Self-pay

## 2020-09-06 ENCOUNTER — Ambulatory Visit
Admission: EM | Admit: 2020-09-06 | Discharge: 2020-09-06 | Disposition: A | Discharge status: Home / Self Care | Payer: Commercial Managed Care - PPO | Attending: Physician Assistant | Admitting: Physician Assistant

## 2020-09-06 ENCOUNTER — Ambulatory Visit: Payer: Self-pay

## 2020-09-06 ENCOUNTER — Encounter: Payer: Self-pay | Admitting: Emergency Medicine

## 2020-09-06 DIAGNOSIS — R21 Rash and other nonspecific skin eruption: Secondary | ICD-10-CM | POA: Diagnosis not present

## 2020-09-06 DIAGNOSIS — B029 Zoster without complications: Secondary | ICD-10-CM | POA: Diagnosis not present

## 2020-09-06 MED ORDER — VALACYCLOVIR HCL 1 G PO TABS: 1000.0000 mg | ORAL_TABLET | Freq: Three times a day (TID) | ORAL | 0 refills | Status: AC

## 2020-09-06 NOTE — ED Provider Notes (Signed)
[facility]    CSN: [id number] Arrival date & time: 09/06/20  1504      History   Chief Complaint Chief Complaint  Patient presents with  . Rash  . Appointment    HPI Sarah Harvey is a 24 y.o. female presenting for painful and burning rash of the right scalp.  Patient states it has been there for about 4 days.  She also admits to enlarged lymph node behind the right ear.  She has not taken any over-the-counter medications.  Patient believes she may have shingles.  She says she has not used any new hair products or dyes but could have potentially caused the rash.  Denies similar problem the past.  Denies any new medications.  She has no other complaints or concerns at this time.  HPI  Past Medical History:  Diagnosis Date  . Asthma   . Depression   . GERD (gastroesophageal reflux disease)   . History of migraine   . POTS (postural orthostatic tachycardia syndrome)   . Seizures ([place])   . Syncope     Patient Active Problem List   Diagnosis Date Noted  . Thrombocytosis 05/29/2019  . POTS (postural orthostatic tachycardia syndrome) 12/15/2018  . History of migraine 12/15/2018  . Asthma 12/15/2018  . Mild depression ([place]) 12/15/2018  . GERD (gastroesophageal reflux disease) 12/15/2018  . Fatigue 12/11/2018    Past Surgical History:  Procedure Laterality Date  . WRIST SURGERY      OB History   No obstetric history on file.      Home Medications    Prior to Admission medications   Medication Sig Start Date End Date Taking? Authorizing Provider  sertraline (ZOLOFT) 100 MG tablet TAKE 1 TABLET BY MOUTH EVERY DAY 05/19/20  Yes Einar Pheasant, MD  TRI-LO-MARZIA 0.18/0.215/0.25 MG-25 MCG tab TAKE 1 TABLET BY MOUTH EVERY DAY 06/30/20  Yes Einar Pheasant, MD  albuterol (PROVENTIL HFA;VENTOLIN HFA) 108 (90 Base) MCG/ACT inhaler Inhale 2 puffs into the lungs every 4 (four) hours as needed for wheezing. 12/13/17   Marylene Land, NP  ibuprofen  (ADVIL,MOTRIN) 800 MG tablet Take 1 tablet (800 mg total) by mouth 3 (three) times daily. 05/29/16   Joy, Shawn C, PA-C  valACYclovir (VALTREX) 1000 MG tablet Take 1 tablet (1,000 mg total) by mouth 3 (three) times daily for 7 days. 09/06/20 09/13/20  Danton Clap, PA-C    Family History Family History  Problem Relation Age of Onset  . Arthritis Mother   . Anxiety disorder Mother   . Miscarriages / [place] Mother   . Alcohol abuse Father   . Depression Father   . Drug abuse Father   . Diabetes Father   . Hyperlipidemia Father   . Hypertension Father   . Arthritis Maternal Grandmother   . Depression Maternal Grandmother   . Hyperlipidemia Maternal Grandmother   . Hypertension Maternal Grandmother   . Learning disabilities Maternal Grandmother   . Cancer Maternal Grandfather   . Heart disease Maternal Grandfather   . Hyperlipidemia Maternal Grandfather   . Cancer Paternal Grandfather     Social History Social History   Tobacco Use  . Smoking status: Never Smoker  . Smokeless tobacco: Never Used  Vaping Use  . Vaping Use: Never used  Substance Use Topics  . Alcohol use: Yes    Comment: every now and then  . Drug use: No     Allergies   Sulfa antibiotics   Review of Systems Review  of Systems  Constitutional: Negative for fatigue and fever.  HENT: Negative for ear pain, rhinorrhea and sore throat.   Respiratory: Negative for cough.   Musculoskeletal: Positive for myalgias.  Skin: Positive for rash.  Neurological: Negative for dizziness and headaches.  Hematological: Positive for adenopathy.     Physical Exam Triage Vital Signs ED Triage Vitals  Enc Vitals Group     BP 09/06/20 1546 (!) 140/92     Pulse Rate 09/06/20 1546 (!) 102     Resp 09/06/20 1546 18     Temp 09/06/20 1546 98.1 F (36.7 C)     Temp Source 09/06/20 1546 Oral     SpO2 09/06/20 1546 100 %     Weight 09/06/20 1546 195 lb (88.5 kg)     Height 09/06/20 1546 5\' 3"  (1.6 m)     Head  Circumference --      Peak Flow --      Pain Score 09/06/20 1545 5     Pain Loc --      Pain Edu? --      Excl. in [place]? --    No data found.  Updated Vital Signs BP (!) 140/92 (BP Location: Left Arm)   Pulse (!) 102   Temp 98.1 F (36.7 C) (Oral)   Resp 18   Ht 5\' 3"  (1.6 m)   Wt 195 lb (88.5 kg)   LMP 09/02/2020 (Exact Date)   SpO2 100%   BMI 34.54 kg/m   Visual Acuity Right Eye Distance:   Left Eye Distance:   Bilateral Distance:    Right Eye Near:   Left Eye Near:    Bilateral Near:     Physical Exam Vitals and nursing note reviewed.  Constitutional:      General: She is not in acute distress.    Appearance: Normal appearance. She is not ill-appearing or toxic-appearing.  HENT:     Head: Normocephalic and atraumatic.     Right Ear: Tympanic membrane, ear canal and external ear normal.     Left Ear: Tympanic membrane, ear canal and external ear normal.     Nose: Nose normal.     Mouth/Throat:     Mouth: Mucous membranes are moist.     Pharynx: Oropharynx is clear.  Eyes:     General: No scleral icterus.       Right eye: No discharge.        Left eye: No discharge.     Conjunctiva/sclera: Conjunctivae normal.  Cardiovascular:     Rate and Rhythm: Normal rate and regular rhythm.     Heart sounds: Normal heart sounds.  Pulmonary:     Effort: Pulmonary effort is normal. No respiratory distress.     Breath sounds: Normal breath sounds.  Musculoskeletal:     Cervical back: Neck supple.  Skin:    General: Skin is dry.     Findings: Rash (deflated vesicles on erythematous base linear right occipital region (C2) dermatome) present.  Neurological:     General: No focal deficit present.     Mental Status: She is alert. Mental status is at baseline.     Motor: No weakness.     Gait: Gait normal.  Psychiatric:        Mood and Affect: Mood normal.        Behavior: Behavior normal.        Thought Content: Thought content normal.      UC Treatments / Results   Labs (all  labs ordered are listed, but only abnormal results are displayed) Labs Reviewed - No data to display  EKG   Radiology No results found.  Procedures Procedures (including critical care time)  Medications Ordered in UC Medications - No data to display  Initial Impression / Assessment and Plan / UC Course  I have reviewed the triage vital signs and the nursing notes.  Pertinent labs & imaging results that were available during my care of the patient were reviewed by me and considered in my medical decision making (see chart for details).    Rash and symptoms do appear to be consistent with herpes zoster.  Treating with Valtrex this time.  Advised Tylenol or Motrin for pain relief.  Discussed methods of transmission/spread to others.  Advised following up with our department as needed.  ED precautions reviewed with patient.   Final Clinical Impressions(s) / UC Diagnoses   Final diagnoses:  Herpes zoster without complication  Rash     Discharge Instructions     Start Valtrex.  You can take ibuprofen and Tylenol as needed for pain.  This should get better over the next 7 to 10 days.  You are contagious until the rash gets crusted over.  You are contagious to anyone who has not had chickenpox, pregnant women, and elderly or immunocompromised people.  You should return for reexamination if you develop any new or worsening symptoms and especially if your rash spreads to your face or affects your eye.   ED Prescriptions    Medication Sig Dispense Auth. Provider   valACYclovir (VALTREX) 1000 MG tablet Take 1 tablet (1,000 mg total) by mouth 3 (three) times daily for 7 days. 21 tablet Danton Clap, PA-C     PDMP not reviewed this encounter.   Danton Clap, PA-C 09/07/20 2053

## 2020-09-06 NOTE — ED Triage Notes (Signed)
Patient in today c/o a painful and burning rash on the right side of her head (in her hair) x 4 days. Patient denies fever. Patient has not taken any OTC medications. Patient denies any new hair products.

## 2020-09-06 NOTE — Discharge Instructions (Addendum)
Start Valtrex.  You can take ibuprofen and Tylenol as needed for pain.  This should get better over the next 7 to 10 days.  You are contagious until the rash gets crusted over.  You are contagious to anyone who has not had chickenpox, pregnant women, and elderly or immunocompromised people.  You should return for reexamination if you develop any new or worsening symptoms and especially if your rash spreads to your face or affects your eye.

## 2020-09-16 ENCOUNTER — Ambulatory Visit
Admission: EM | Admit: 2020-09-16 | Discharge: 2020-09-16 | Disposition: A | Discharge status: Home / Self Care | Payer: Commercial Managed Care - PPO | Attending: Family Medicine | Admitting: Family Medicine

## 2020-09-16 ENCOUNTER — Other Ambulatory Visit: Payer: Self-pay

## 2020-09-16 DIAGNOSIS — B349 Viral infection, unspecified: Secondary | ICD-10-CM

## 2020-09-16 NOTE — ED Triage Notes (Signed)
Pt presents with nasal congestion that began a few weeks ago, needs covid test for work

## 2020-09-16 NOTE — Discharge Instructions (Addendum)
This is most likely something viral.  Covid and flu swab pending.  Recommend over-the-counter medicines as needed.  Rest, hydrate Follow up as needed for continued or worsening symptoms

## 2020-09-17 NOTE — ED Provider Notes (Signed)
[facility]    CSN: [id number] Arrival date & time: 09/16/20  0957      History   Chief Complaint Chief Complaint  Patient presents with  . Nasal Congestion    HPI Jennfer Gassen is a 25 y.o. female.   Patient is 25 year old female presents today with nasal congestion that began a few days ago.  Symptoms have been constant.  Taking over-the-counter medicines for symptoms.  No fever, chills, cough, chest congestion.     Past Medical History:  Diagnosis Date  . Asthma   . Depression   . GERD (gastroesophageal reflux disease)   . History of migraine   . POTS (postural orthostatic tachycardia syndrome)   . Seizures ([facility])   . Syncope     Patient Active Problem List   Diagnosis Date Noted  . Thrombocytosis 05/29/2019  . POTS (postural orthostatic tachycardia syndrome) 12/15/2018  . History of migraine 12/15/2018  . Asthma 12/15/2018  . Mild depression ([facility]) 12/15/2018  . GERD (gastroesophageal reflux disease) 12/15/2018  . Fatigue 12/11/2018    Past Surgical History:  Procedure Laterality Date  . WRIST SURGERY      OB History   No obstetric history on file.      Home Medications    Prior to Admission medications   Medication Sig Start Date End Date Taking? Authorizing Provider  albuterol (PROVENTIL HFA;VENTOLIN HFA) 108 (90 Base) MCG/ACT inhaler Inhale 2 puffs into the lungs every 4 (four) hours as needed for wheezing. 12/13/17   Renford Dills, NP  ibuprofen (ADVIL,MOTRIN) 800 MG tablet Take 1 tablet (800 mg total) by mouth 3 (three) times daily. 05/29/16   Joy, Shawn C, PA-C  sertraline (ZOLOFT) 100 MG tablet TAKE 1 TABLET BY MOUTH EVERY DAY 05/19/20   Dale [place], MD  TRI-LO-MARZIA 0.18/0.215/0.25 MG-25 MCG tab TAKE 1 TABLET BY MOUTH EVERY DAY 06/30/20   Dale [place], MD    Family History Family History  Problem Relation Age of Onset  . Arthritis Mother   . Anxiety disorder Mother   . Miscarriages / [place] Mother   . Alcohol  abuse Father   . Depression Father   . Drug abuse Father   . Diabetes Father   . Hyperlipidemia Father   . Hypertension Father   . Arthritis Maternal Grandmother   . Depression Maternal Grandmother   . Hyperlipidemia Maternal Grandmother   . Hypertension Maternal Grandmother   . Learning disabilities Maternal Grandmother   . Cancer Maternal Grandfather   . Heart disease Maternal Grandfather   . Hyperlipidemia Maternal Grandfather   . Cancer Paternal Grandfather     Social History Social History   Tobacco Use  . Smoking status: Never Smoker  . Smokeless tobacco: Never Used  Vaping Use  . Vaping Use: Never used  Substance Use Topics  . Alcohol use: Yes    Comment: every now and then  . Drug use: No     Allergies   Sulfa antibiotics   Review of Systems Review of Systems   Physical Exam Triage Vital Signs ED Triage Vitals  Enc Vitals Group     BP 09/16/20 1014 (!) 121/93     Pulse Rate 09/16/20 1014 (!) 104     Resp 09/16/20 1014 16     Temp 09/16/20 1014 98.8 F (37.1 C)     Temp Source 09/16/20 1014 Oral     SpO2 09/16/20 1014 97 %     Weight --      Height --  Head Circumference --      Peak Flow --      Pain Score 09/16/20 1017 0     Pain Loc --      Pain Edu? --      Excl. in [place]? --    No data found.  Updated Vital Signs BP (!) 121/93 (BP Location: Right Arm)   Pulse (!) 104   Temp 98.8 F (37.1 C) (Oral)   Resp 16   LMP 09/02/2020 (Exact Date)   SpO2 97%   Visual Acuity Right Eye Distance:   Left Eye Distance:   Bilateral Distance:    Right Eye Near:   Left Eye Near:    Bilateral Near:     Physical Exam Vitals and nursing note reviewed.  Constitutional:      General: She is not in acute distress.    Appearance: Normal appearance. She is not ill-appearing, toxic-appearing or diaphoretic.  HENT:     Head: Normocephalic.     Right Ear: Tympanic membrane and ear canal normal.     Left Ear: Tympanic membrane and ear canal  normal.     Nose: Congestion present.     Mouth/Throat:     Pharynx: Oropharynx is clear.  Eyes:     Conjunctiva/sclera: Conjunctivae normal.  Cardiovascular:     Rate and Rhythm: Normal rate and regular rhythm.  Pulmonary:     Effort: Pulmonary effort is normal.     Breath sounds: Normal breath sounds.  Musculoskeletal:        General: Normal range of motion.     Cervical back: Normal range of motion.  Skin:    General: Skin is warm and dry.     Findings: No rash.  Neurological:     Mental Status: She is alert.  Psychiatric:        Mood and Affect: Mood normal.      UC Treatments / Results  Labs (all labs ordered are listed, but only abnormal results are displayed) Labs Reviewed  COVID-19, FLU A+B NAA    EKG   Radiology No results found.  Procedures Procedures (including critical care time)  Medications Ordered in UC Medications - No data to display  Initial Impression / Assessment and Plan / UC Course  I have reviewed the triage vital signs and the nursing notes.  Pertinent labs & imaging results that were available during my care of the patient were reviewed by me and considered in my medical decision making (see chart for details).     Viral illness Covid and flu swab pending.  Recommend over-the-counter medicines as needed. Final Clinical Impressions(s) / UC Diagnoses   Final diagnoses:  Viral illness     Discharge Instructions     This is most likely something viral.  Covid and flu swab pending.  Recommend over-the-counter medicines as needed.  Rest, hydrate Follow up as needed for continued or worsening symptoms     ED Prescriptions    None     PDMP not reviewed this encounter.   Janace Aris, NP 09/17/20 [id number]

## 2020-09-20 LAB — COVID-19, FLU A+B NAA
Influenza A, NAA: NOT DETECTED
Influenza B, NAA: NOT DETECTED
SARS-CoV-2, NAA: DETECTED — AB

## 2020-10-15 ENCOUNTER — Encounter: Payer: Self-pay | Admitting: *Deleted

## 2020-11-16 ENCOUNTER — Encounter: Payer: Self-pay | Admitting: Emergency Medicine

## 2020-11-16 ENCOUNTER — Ambulatory Visit (INDEPENDENT_AMBULATORY_CARE_PROVIDER_SITE_OTHER): Payer: BC Managed Care – PPO

## 2020-11-16 ENCOUNTER — Ambulatory Visit
Admission: EM | Admit: 2020-11-16 | Discharge: 2020-11-16 | Disposition: A | Discharge status: Home / Self Care | Payer: BC Managed Care – PPO | Attending: Family Medicine | Admitting: Family Medicine

## 2020-11-16 DIAGNOSIS — M25532 Pain in left wrist: Secondary | ICD-10-CM | POA: Diagnosis not present

## 2020-11-16 DIAGNOSIS — S6992XA Unspecified injury of left wrist, hand and finger(s), initial encounter: Secondary | ICD-10-CM

## 2020-11-16 MED ORDER — IBUPROFEN 600 MG PO TABS: 600.0000 mg | ORAL_TABLET | Freq: Three times a day (TID) | ORAL | 0 refills | Status: DC | PRN

## 2020-11-16 NOTE — Discharge Instructions (Addendum)
Wear the wrist splint for the next few weeks until feeling better.  Rest, Ice Ibuprofen 600 mg every 8 hours for pain, inflammation. Follow up with orthopedics as needed.

## 2020-11-16 NOTE — ED Triage Notes (Signed)
Pt presents today with left wrist pain x 2 weeks. She reports that a child at school twisted it. Pain 7/10.

## 2020-11-17 ENCOUNTER — Other Ambulatory Visit: Payer: Self-pay | Admitting: Internal Medicine

## 2020-11-17 NOTE — ED Provider Notes (Signed)
[facility]    CSN: [id number] Arrival date & time: 11/16/20  1304      History   Chief Complaint Chief Complaint  Patient presents with  . Wrist Pain    left    HPI Sarah Harvey is a 25 y.o. female.   Patient is a 25 year old female presents today with complaints of left wrist pain.  This been present for the past 2 weeks.  Started after someone twisted the hand and arm.  Reporting that the pain is not improved at all.  She has been icing the hand and taking some ibuprofen.  No significant bruising or swelling.     Past Medical History:  Diagnosis Date  . Asthma   . Depression   . GERD (gastroesophageal reflux disease)   . History of migraine   . POTS (postural orthostatic tachycardia syndrome)   . Seizures ([place])   . Syncope     Patient Active Problem List   Diagnosis Date Noted  . Thrombocytosis 05/29/2019  . POTS (postural orthostatic tachycardia syndrome) 12/15/2018  . History of migraine 12/15/2018  . Asthma 12/15/2018  . Mild depression ([place]) 12/15/2018  . GERD (gastroesophageal reflux disease) 12/15/2018  . Fatigue 12/11/2018    Past Surgical History:  Procedure Laterality Date  . WRIST SURGERY      OB History   No obstetric history on file.      Home Medications    Prior to Admission medications   Medication Sig Start Date End Date Taking? Authorizing Provider  ibuprofen (ADVIL) 600 MG tablet Take 1 tablet (600 mg total) by mouth every 8 (eight) hours as needed for moderate pain. 11/16/20  Yes Vedanth Sirico A, NP  sertraline (ZOLOFT) 100 MG tablet TAKE 1 TABLET BY MOUTH EVERY DAY 05/19/20  Yes Einar Pheasant, MD  TRI-LO-MARZIA 0.18/0.215/0.25 MG-25 MCG tab TAKE 1 TABLET BY MOUTH EVERY DAY 06/30/20  Yes Einar Pheasant, MD  albuterol (PROVENTIL HFA;VENTOLIN HFA) 108 (90 Base) MCG/ACT inhaler Inhale 2 puffs into the lungs every 4 (four) hours as needed for wheezing. 12/13/17   Marylene Land, NP    Family History Family History   Problem Relation Age of Onset  . Arthritis Mother   . Anxiety disorder Mother   . Miscarriages / [place] Mother   . Alcohol abuse Father   . Depression Father   . Drug abuse Father   . Diabetes Father   . Hyperlipidemia Father   . Hypertension Father   . Arthritis Maternal Grandmother   . Depression Maternal Grandmother   . Hyperlipidemia Maternal Grandmother   . Hypertension Maternal Grandmother   . Learning disabilities Maternal Grandmother   . Cancer Maternal Grandfather   . Heart disease Maternal Grandfather   . Hyperlipidemia Maternal Grandfather   . Cancer Paternal Grandfather     Social History Social History   Tobacco Use  . Smoking status: Never Smoker  . Smokeless tobacco: Never Used  Vaping Use  . Vaping Use: Never used  Substance Use Topics  . Alcohol use: Yes    Comment: every now and then  . Drug use: No     Allergies   Sulfa antibiotics   Review of Systems Review of Systems   Physical Exam Triage Vital Signs ED Triage Vitals  Enc Vitals Group     BP 11/16/20 1319 128/74     Pulse Rate 11/16/20 1319 92     Resp 11/16/20 1319 18     Temp 11/16/20 1319 99.3 F (  37.4 C)     Temp Source 11/16/20 1319 Oral     SpO2 11/16/20 1319 97 %     Weight 11/16/20 1327 190 lb (86.2 kg)     Height 11/16/20 1327 5\' 3"  (1.6 m)     Head Circumference --      Peak Flow --      Pain Score 11/16/20 1327 7     Pain Loc --      Pain Edu? --      Excl. in [place]? --    No data found.  Updated Vital Signs BP 128/74 (BP Location: Left Arm)   Pulse 92   Temp 99.3 F (37.4 C) (Oral)   Resp 18   Ht 5\' 3"  (1.6 m)   Wt 190 lb (86.2 kg)   LMP 10/26/2020 (Approximate)   SpO2 97%   BMI 33.66 kg/m   Visual Acuity Right Eye Distance:   Left Eye Distance:   Bilateral Distance:    Right Eye Near:   Left Eye Near:    Bilateral Near:     Physical Exam Vitals and nursing note reviewed.  Constitutional:      General: She is not in acute distress.     Appearance: Normal appearance. She is not ill-appearing, toxic-appearing or diaphoretic.  HENT:     Head: Normocephalic.  Eyes:     Conjunctiva/sclera: Conjunctivae normal.  Pulmonary:     Effort: Pulmonary effort is normal.  Musculoskeletal:        General: Normal range of motion.       Hands:     Cervical back: Normal range of motion.     Comments: TTP, no bruising, swelling, deformity.  Limited ROM.   Skin:    General: Skin is warm and dry.     Findings: No rash.  Neurological:     Mental Status: She is alert.  Psychiatric:        Mood and Affect: Mood normal.      UC Treatments / Results  Labs (all labs ordered are listed, but only abnormal results are displayed) Labs Reviewed - No data to display  EKG   Radiology DG Wrist Complete Left  Result Date: 11/16/2020 CLINICAL DATA:  Left wrist injury following twisting injury 2 weeks ago. EXAM: LEFT WRIST - COMPLETE 3+ VIEW COMPARISON:  Radiographs 11/02/2014 FINDINGS: The mineralization and alignment are normal. There is no evidence of acute fracture or dislocation. The joint spaces are preserved. No foreign body or focal soft tissue swelling identified. IMPRESSION: Normal examination. Electronically Signed   By: Richardean Sale M.D.   On: 11/16/2020 14:10    Procedures Procedures (including critical care time)  Medications Ordered in UC Medications - No data to display  Initial Impression / Assessment and Plan / UC Course  I have reviewed the triage vital signs and the nursing notes.  Pertinent labs & imaging results that were available during my care of the patient were reviewed by me and considered in my medical decision making (see chart for details).     Left hand injury No acute fractures on x ray Possible ligament strain.  Splint applied here and recommended wear this for the next few weeks.  RICE.  Ibuprofen 600 mg every 8 hours for pain and inflammation.  Ortho follow up as needed.   Final Clinical  Impressions(s) / UC Diagnoses   Final diagnoses:  Injury of left hand, initial encounter     Discharge Instructions  Wear the wrist splint for the next few weeks until feeling better.  Rest, Ice Ibuprofen 600 mg every 8 hours for pain, inflammation. Follow up with orthopedics as needed.      ED Prescriptions    Medication Sig Dispense Auth. Provider   ibuprofen (ADVIL) 600 MG tablet Take 1 tablet (600 mg total) by mouth every 8 (eight) hours as needed for moderate pain. 30 tablet Loura Halt A, NP     PDMP not reviewed this encounter.   Loura Halt A, NP 11/17/20 1110

## 2020-11-18 NOTE — Telephone Encounter (Signed)
No OV since 12/01/18 please advise to refill?

## 2020-11-19 NOTE — Telephone Encounter (Signed)
I have refilled the zoloft for 30 days.  She needs a f/u appt before next refill.  Have not seen her since 11/2018.

## 2020-11-19 NOTE — Telephone Encounter (Signed)
Unable to lm to schedule appt  No VM

## 2020-11-28 ENCOUNTER — Ambulatory Visit: Payer: Self-pay

## 2020-11-29 ENCOUNTER — Ambulatory Visit: Payer: Self-pay

## 2020-12-14 ENCOUNTER — Other Ambulatory Visit: Payer: Self-pay | Admitting: Internal Medicine

## 2020-12-15 ENCOUNTER — Other Ambulatory Visit: Payer: Self-pay | Admitting: Internal Medicine

## 2021-01-23 ENCOUNTER — Ambulatory Visit: Payer: Self-pay

## 2021-02-09 ENCOUNTER — Other Ambulatory Visit: Payer: Self-pay

## 2021-02-09 ENCOUNTER — Ambulatory Visit
Admission: RE | Admit: 2021-02-09 | Discharge: 2021-02-09 | Disposition: A | Discharge status: Home / Self Care | Payer: BC Managed Care – PPO | Source: Ambulatory Visit | Attending: Emergency Medicine | Admitting: Emergency Medicine

## 2021-02-09 VITALS — BP 121/102 | HR 118 | Temp 98.7°F | Resp 18 | Ht 63.0 in | Wt 198.0 lb

## 2021-02-09 DIAGNOSIS — J029 Acute pharyngitis, unspecified: Secondary | ICD-10-CM | POA: Diagnosis present

## 2021-02-09 DIAGNOSIS — J020 Streptococcal pharyngitis: Secondary | ICD-10-CM | POA: Diagnosis not present

## 2021-02-09 DIAGNOSIS — Z20822 Contact with and (suspected) exposure to covid-19: Secondary | ICD-10-CM | POA: Diagnosis not present

## 2021-02-09 DIAGNOSIS — Z882 Allergy status to sulfonamides status: Secondary | ICD-10-CM | POA: Diagnosis not present

## 2021-02-09 DIAGNOSIS — Z79899 Other long term (current) drug therapy: Secondary | ICD-10-CM | POA: Insufficient documentation

## 2021-02-09 LAB — RESP PANEL BY RT-PCR (FLU A&B, COVID) ARPGX2
Influenza A by PCR: NEGATIVE
Influenza B by PCR: NEGATIVE
SARS Coronavirus 2 by RT PCR: NEGATIVE

## 2021-02-09 LAB — GROUP A STREP BY PCR: Group A Strep by PCR: DETECTED — AB

## 2021-02-09 MED ORDER — AMOXICILLIN-POT CLAVULANATE 875-125 MG PO TABS: 1.0000 | ORAL_TABLET | Freq: Two times a day (BID) | ORAL | 0 refills | Status: AC

## 2021-02-09 MED ORDER — LIDOCAINE VISCOUS HCL 2 % MT SOLN: 15.0000 mL | OROMUCOSAL | 0 refills | Status: DC | PRN

## 2021-02-09 NOTE — ED Provider Notes (Signed)
[facility]    CSN: [id number] Arrival date & time: 02/09/21  1347      History   Chief Complaint Chief Complaint  Patient presents with  . Sore Throat    HPI Sarah Harvey is a 25 y.o. female.   HPI   25 year old female here for evaluation of sore throat.  Patient reports that she has had a sore throat for the last 3 days that intensified greatly last night into this morning.  She started running a fever yesterday with T-max of 100.2 and she also developed 1 episode of diarrhea yesterday and 2 episodes of vomiting.  She has had associated symptoms of headache and body aches.  She denies runny nose nasal congestion, ear pain or pressure, or cough.  Past Medical History:  Diagnosis Date  . Asthma   . Depression   . GERD (gastroesophageal reflux disease)   . History of migraine   . POTS (postural orthostatic tachycardia syndrome)   . Seizures ([place])   . Syncope     Patient Active Problem List   Diagnosis Date Noted  . Thrombocytosis 05/29/2019  . POTS (postural orthostatic tachycardia syndrome) 12/15/2018  . History of migraine 12/15/2018  . Asthma 12/15/2018  . Mild depression ([place]) 12/15/2018  . GERD (gastroesophageal reflux disease) 12/15/2018  . Fatigue 12/11/2018    Past Surgical History:  Procedure Laterality Date  . WRIST SURGERY      OB History   No obstetric history on file.      Home Medications    Prior to Admission medications   Medication Sig Start Date End Date Taking? Authorizing Provider  amoxicillin-clavulanate (AUGMENTIN) 875-125 MG tablet Take 1 tablet by mouth every 12 (twelve) hours for 10 days. 02/09/21 02/19/21 Yes Margarette Canada, NP  lidocaine (XYLOCAINE) 2 % solution Use as directed 15 mLs in the mouth or throat as needed for mouth pain. 02/09/21  Yes Margarette Canada, NP  sertraline (ZOLOFT) 100 MG tablet TAKE 1 TABLET BY MOUTH EVERY DAY 12/14/20  Yes Einar Pheasant, MD  TRI-LO-MARZIA 0.18/0.215/0.25 MG-25 MCG tab TAKE 1  TABLET BY MOUTH EVERY DAY 12/15/20  Yes Einar Pheasant, MD  albuterol (PROVENTIL HFA;VENTOLIN HFA) 108 (90 Base) MCG/ACT inhaler Inhale 2 puffs into the lungs every 4 (four) hours as needed for wheezing. 12/13/17   Marylene Land, NP  ibuprofen (ADVIL) 600 MG tablet Take 1 tablet (600 mg total) by mouth every 8 (eight) hours as needed for moderate pain. 11/16/20   Orvan July, NP    Family History Family History  Problem Relation Age of Onset  . Arthritis Mother   . Anxiety disorder Mother   . Miscarriages / [place] Mother   . Alcohol abuse Father   . Depression Father   . Drug abuse Father   . Diabetes Father   . Hyperlipidemia Father   . Hypertension Father   . Arthritis Maternal Grandmother   . Depression Maternal Grandmother   . Hyperlipidemia Maternal Grandmother   . Hypertension Maternal Grandmother   . Learning disabilities Maternal Grandmother   . Cancer Maternal Grandfather   . Heart disease Maternal Grandfather   . Hyperlipidemia Maternal Grandfather   . Cancer Paternal Grandfather     Social History Social History   Tobacco Use  . Smoking status: Never Smoker  . Smokeless tobacco: Never Used  Vaping Use  . Vaping Use: Never used  Substance Use Topics  . Alcohol use: Yes    Comment: every now and then  .  Drug use: No     Allergies   Sulfa antibiotics   Review of Systems Review of Systems  Constitutional: Positive for fever. Negative for activity change and appetite change.  HENT: Positive for sore throat. Negative for congestion, ear pain and rhinorrhea.   Respiratory: Negative for cough, shortness of breath and wheezing.   Gastrointestinal: Positive for diarrhea, nausea and vomiting. Negative for abdominal pain.  Musculoskeletal: Positive for arthralgias and myalgias.  Skin: Negative for rash.  Neurological: Positive for headaches.  Hematological: Negative.   Psychiatric/Behavioral: Negative.      Physical Exam Triage Vital Signs ED Triage  Vitals  Enc Vitals Group     BP 02/09/21 1407 (!) 121/102     Pulse Rate 02/09/21 1407 (!) 118     Resp 02/09/21 1407 18     Temp 02/09/21 1407 98.7 F (37.1 C)     Temp Source 02/09/21 1407 Oral     SpO2 02/09/21 1407 99 %     Weight 02/09/21 1405 198 lb (89.8 kg)     Height 02/09/21 1405 5\' 3"  (1.6 m)     Head Circumference --      Peak Flow --      Pain Score 02/09/21 1405 7     Pain Loc --      Pain Edu? --      Excl. in [place]? --    No data found.  Updated Vital Signs BP (!) 121/102 (BP Location: Left Arm)   Pulse (!) 118   Temp 98.7 F (37.1 C) (Oral)   Resp 18   Ht 5\' 3"  (1.6 m)   Wt 198 lb (89.8 kg)   LMP 01/18/2021 (Approximate)   SpO2 99%   BMI 35.07 kg/m   Visual Acuity Right Eye Distance:   Left Eye Distance:   Bilateral Distance:    Right Eye Near:   Left Eye Near:    Bilateral Near:     Physical Exam Vitals and nursing note reviewed.  Constitutional:      General: She is not in acute distress.    Appearance: She is well-developed. She is not ill-appearing.  HENT:     Head: Normocephalic and atraumatic.     Right Ear: Tympanic membrane and ear canal normal. Tympanic membrane is not erythematous.     Left Ear: Tympanic membrane and ear canal normal. Tympanic membrane is not erythematous.     Nose: Congestion present. No rhinorrhea.     Mouth/Throat:     Mouth: Mucous membranes are moist.     Pharynx: Uvula midline. Posterior oropharyngeal erythema present.     Tonsils: Tonsillar exudate present. 2+ on the right. 2+ on the left.  Cardiovascular:     Rate and Rhythm: Normal rate and regular rhythm.     Heart sounds: Normal heart sounds. No murmur heard. No gallop.   Pulmonary:     Effort: Pulmonary effort is normal.     Breath sounds: Normal breath sounds. No wheezing, rhonchi or rales.  Musculoskeletal:     Cervical back: Normal range of motion and neck supple.  Lymphadenopathy:     Cervical: Cervical adenopathy present.  Skin:    General:  Skin is warm and dry.     Capillary Refill: Capillary refill takes less than 2 seconds.     Findings: No erythema or rash.  Neurological:     General: No focal deficit present.     Mental Status: She is alert and oriented to person, place,  and time.  Psychiatric:        Mood and Affect: Mood normal.        Behavior: Behavior normal.      UC Treatments / Results  Labs (all labs ordered are listed, but only abnormal results are displayed) Labs Reviewed  GROUP A STREP BY PCR - Abnormal; Notable for the following components:      Result Value   Group A Strep by PCR DETECTED (*)    All other components within normal limits  RESP PANEL BY RT-PCR (FLU A&B, COVID) [id number]    EKG   Radiology No results found.  Procedures Procedures (including critical care time)  Medications Ordered in UC Medications - No data to display  Initial Impression / Assessment and Plan / UC Course  I have reviewed the triage vital signs and the nursing notes.  Pertinent labs & imaging results that were available during my care of the patient were reviewed by me and considered in my medical decision making (see chart for details).   Patient is a very pleasant 25 year old female who is here for evaluation of sore throat, fever, body aches, headache, diarrhea, nausea, and vomiting.  She has had no other upper respiratory symptoms or lower respiratory symptoms.  Patient's physical exam reveals pearly gray tympanic membranes bilaterally with a normal light reflex and clear external auditory canals.  Nasal mucosa is erythematous and edematous with out overt nasal discharge.  Oropharyngeal exam reveals tube plus tonsillar edema bilaterally with white exudate and erythema.  Posterior oropharynx is erythematous with clear postnasal drip as well.  Patient does have bilateral cervical lymphadenopathy.  Cardiopulmonary exam is benign.  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  Will send strep PCR and respiratory triplex panel.  Respiratory triplex panel is negative for COVID or flu.  Strep PCR is positive.  Will treat patient with Augmentin twice daily for 10 days for treatment of her strep throat.  Also advised patient to use over-the-counter ibuprofen and Tylenol as needed for pain as well as salt water gargles.  We will also prescribe viscous lidocaine to be used for severe pain.   Final Clinical Impressions(s) / UC Diagnoses   Final diagnoses:  Strep throat     Discharge Instructions     Take the Augmentin twice daily for 10 days for treatment of your strep throat.  Use Tylenol and ibuprofen as needed for mild to moderate pain.  Take them according to the package instructions.  Use the viscous lidocaine as needed for severe pain.  You can use 15 mL at a time gargle and swallow.  Return for reevaluation for any new or worsening symptoms.    ED Prescriptions    Medication Sig Dispense Auth. Provider   amoxicillin-clavulanate (AUGMENTIN) 875-125 MG tablet Take 1 tablet by mouth every 12 (twelve) hours for 10 days. 20 tablet Margarette Canada, NP   lidocaine (XYLOCAINE) 2 % solution Use as directed 15 mLs in the mouth or throat as needed for mouth pain. 100 mL Margarette Canada, NP     PDMP not reviewed this encounter.   Margarette Canada, NP 02/09/21 1520

## 2021-02-09 NOTE — Discharge Instructions (Addendum)
Take the Augmentin twice daily for 10 days for treatment of your strep throat.  Use Tylenol and ibuprofen as needed for mild to moderate pain.  Take them according to the package instructions.  Use the viscous lidocaine as needed for severe pain.  You can use 15 mL at a time gargle and swallow.  Return for reevaluation for any new or worsening symptoms.

## 2021-02-09 NOTE — ED Triage Notes (Signed)
Pt c/o sore throat since Saturday. Pt states it has gotten worse since last night. Pt also reports some n/v/d and an elevated temp (100.2). Pt took an at-home COVID test and it was negative. Pt denies nasal congestion or cough.

## 2021-03-18 ENCOUNTER — Other Ambulatory Visit: Payer: Self-pay | Admitting: Internal Medicine

## 2021-05-01 ENCOUNTER — Ambulatory Visit: Payer: Self-pay

## 2021-05-02 ENCOUNTER — Ambulatory Visit
Admission: RE | Admit: 2021-05-02 | Discharge: 2021-05-02 | Disposition: A | Discharge status: Home / Self Care | Payer: Self-pay | Source: Ambulatory Visit | Attending: Internal Medicine | Admitting: Internal Medicine

## 2021-05-02 ENCOUNTER — Other Ambulatory Visit: Payer: Self-pay

## 2021-05-02 ENCOUNTER — Ambulatory Visit (INDEPENDENT_AMBULATORY_CARE_PROVIDER_SITE_OTHER): Payer: Self-pay

## 2021-05-02 VITALS — BP 125/74 | HR 81 | Temp 98.5°F | Resp 18 | Ht 63.0 in | Wt 198.0 lb

## 2021-05-02 DIAGNOSIS — W19XXXA Unspecified fall, initial encounter: Secondary | ICD-10-CM

## 2021-05-02 DIAGNOSIS — M79672 Pain in left foot: Secondary | ICD-10-CM

## 2021-05-02 DIAGNOSIS — M25572 Pain in left ankle and joints of left foot: Secondary | ICD-10-CM

## 2021-05-02 MED ORDER — ETODOLAC 400 MG PO TABS: 400.0000 mg | ORAL_TABLET | Freq: Two times a day (BID) | ORAL | 0 refills | Status: DC

## 2021-05-02 NOTE — ED Triage Notes (Signed)
Pt c/o left foot, and ankle pain. Started about 4 days ago. She states she tripped over her sons toy in the floor and had pain shortly after.

## 2021-05-02 NOTE — ED Provider Notes (Signed)
[facility]    CSN: [id number] Arrival date & time: 05/02/21  1143      History   Chief Complaint Chief Complaint  Patient presents with   Foot Pain    left    HPI Sarah Harvey is a 25 y.o. female.   HPI  Foot Pain: Patient presents with her mother.  Patient states that a few days ago she was walking and accidentally tripped over her son's large dinosaur toy with her left foot.  She states that she had extreme pain right after and has had continuous pain.  Pain is located in her foot and ankle area.  Pain rated 8 out of 10 and constant in nature.  She has used ice for symptoms without much relief.  No significant numbness, tingling or skin breakdown.  No past injuries to this area.  Past Medical History:  Diagnosis Date   Asthma    Depression    GERD (gastroesophageal reflux disease)    History of migraine    POTS (postural orthostatic tachycardia syndrome)    Seizures ([place])    Syncope     Patient Active Problem List   Diagnosis Date Noted   Thrombocytosis 05/29/2019   POTS (postural orthostatic tachycardia syndrome) 12/15/2018   History of migraine 12/15/2018   Asthma 12/15/2018   Mild depression ([place]) 12/15/2018   GERD (gastroesophageal reflux disease) 12/15/2018   Fatigue 12/11/2018    Past Surgical History:  Procedure Laterality Date   WRIST SURGERY      OB History   No obstetric history on file.      Home Medications    Prior to Admission medications   Medication Sig Start Date End Date Taking? Authorizing Provider  sertraline (ZOLOFT) 100 MG tablet TAKE 1 TABLET BY MOUTH EVERY DAY 12/14/20  Yes Einar Pheasant, MD  TRI-LO-MARZIA 0.18/0.215/0.25 MG-25 MCG tab TAKE 1 TABLET BY MOUTH EVERY DAY 03/18/21  Yes Einar Pheasant, MD  albuterol (PROVENTIL HFA;VENTOLIN HFA) 108 (90 Base) MCG/ACT inhaler Inhale 2 puffs into the lungs every 4 (four) hours as needed for wheezing. 12/13/17   Marylene Land, NP  ibuprofen (ADVIL) 600 MG tablet Take  1 tablet (600 mg total) by mouth every 8 (eight) hours as needed for moderate pain. 11/16/20   Loura Halt A, NP  lidocaine (XYLOCAINE) 2 % solution Use as directed 15 mLs in the mouth or throat as needed for mouth pain. 02/09/21   Margarette Canada, NP    Family History Family History  Problem Relation Age of Onset   Arthritis Mother    Anxiety disorder Mother    Miscarriages / [place] Mother    Alcohol abuse Father    Depression Father    Drug abuse Father    Diabetes Father    Hyperlipidemia Father    Hypertension Father    Arthritis Maternal Grandmother    Depression Maternal Grandmother    Hyperlipidemia Maternal Grandmother    Hypertension Maternal Grandmother    Learning disabilities Maternal Grandmother    Cancer Maternal Grandfather    Heart disease Maternal Grandfather    Hyperlipidemia Maternal Grandfather    Cancer Paternal Grandfather     Social History Social History   Tobacco Use   Smoking status: Never   Smokeless tobacco: Never  Vaping Use   Vaping Use: Never used  Substance Use Topics   Alcohol use: Yes    Comment: every now and then   Drug use: No     Allergies  Sulfa antibiotics   Review of Systems Review of Systems  As stated above in HPI Physical Exam Triage Vital Signs ED Triage Vitals  Enc Vitals Group     BP 05/02/21 1201 125/74     Pulse Rate 05/02/21 1201 81     Resp 05/02/21 1201 18     Temp 05/02/21 1201 98.5 F (36.9 C)     Temp Source 05/02/21 1201 Oral     SpO2 05/02/21 1201 99 %     Weight 05/02/21 1202 197 lb 15.6 oz (89.8 kg)     Height 05/02/21 1202 '5\' 3"'$  (1.6 m)     Head Circumference --      Peak Flow --      Pain Score 05/02/21 1202 8     Pain Loc --      Pain Edu? --      Excl. in [place]? --    No data found.  Updated Vital Signs BP 125/74 (BP Location: Left Arm)   Pulse 81   Temp 98.5 F (36.9 C) (Oral)   Resp 18   Ht '5\' 3"'$  (1.6 m)   Wt 197 lb 15.6 oz (89.8 kg)   LMP 04/25/2021 (Approximate)   SpO2 99%    BMI 35.07 kg/m   Physical Exam Vitals and nursing note reviewed.  Constitutional:      General: She is not in acute distress.    Appearance: Normal appearance. She is not ill-appearing, toxic-appearing or diaphoretic.  HENT:     Head: Normocephalic and atraumatic.  Cardiovascular:     Pulses: Normal pulses.  Musculoskeletal:        General: Tenderness (tenderness throughout left foot and ankle) present. No swelling.     Right lower leg: No edema.     Left lower leg: No edema.     Comments: ROM foot and ankle greatly limited due to patient  Skin:    General: Skin is warm.     Coloration: Skin is not jaundiced or pale.     Findings: No bruising, erythema, lesion or rash.  Neurological:     Mental Status: She is alert and oriented to person, place, and time.     UC Treatments / Results  Labs (all labs ordered are listed, but only abnormal results are displayed) Labs Reviewed - No data to display  EKG   Radiology No results found.  Procedures Procedures (including critical care time)  Medications Ordered in UC Medications - No data to display  Initial Impression / Assessment and Plan / UC Course  I have reviewed the triage vital signs and the nursing notes.  Pertinent labs & imaging results that were available during my care of the patient were reviewed by me and considered in my medical decision making (see chart for details).     New.  X-ray pending.  Update there is an area of concern within the foot however the x-ray is read as negative.  Would have been to have [place] do is wrapped her with an Ace wrap and have her nonweightbearing for 2 weeks.  I want her to follow-up with podiatry or orthopedics for further evaluation.  I have sent in loading to the pharmacy for her.  Discussed how to use this medication along with common potential side effects and precautions in terms of GI bleed.  Work note given. Final Clinical Impressions(s) / UC Diagnoses   Final  diagnoses:  None   Discharge Instructions   None    ED  Prescriptions   None    PDMP not reviewed this encounter.   Hughie Closs, [place] 05/02/21 1259

## 2021-05-15 ENCOUNTER — Other Ambulatory Visit: Payer: Self-pay | Admitting: Internal Medicine

## 2021-07-25 ENCOUNTER — Other Ambulatory Visit: Payer: Self-pay | Admitting: Internal Medicine

## 2021-07-27 ENCOUNTER — Ambulatory Visit
Admission: EM | Admit: 2021-07-27 | Discharge: 2021-07-27 | Disposition: A | Discharge status: Home / Self Care | Payer: Self-pay | Attending: Emergency Medicine | Admitting: Emergency Medicine

## 2021-07-27 ENCOUNTER — Other Ambulatory Visit: Payer: Self-pay

## 2021-07-27 ENCOUNTER — Encounter: Payer: Self-pay | Admitting: Emergency Medicine

## 2021-07-27 DIAGNOSIS — J069 Acute upper respiratory infection, unspecified: Secondary | ICD-10-CM

## 2021-07-27 MED ORDER — BENZONATATE 100 MG PO CAPS: 200.0000 mg | ORAL_CAPSULE | Freq: Three times a day (TID) | ORAL | 0 refills | Status: DC

## 2021-07-27 MED ORDER — IPRATROPIUM BROMIDE 0.06 % NA SOLN: 2.0000 | Freq: Four times a day (QID) | NASAL | 12 refills | Status: DC

## 2021-07-27 MED ORDER — PROMETHAZINE-DM 6.25-15 MG/5ML PO SYRP: 5.0000 mL | ORAL_SOLUTION | Freq: Four times a day (QID) | ORAL | 0 refills | Status: DC | PRN

## 2021-07-27 NOTE — Discharge Instructions (Signed)
Use the Atrovent nasal spray, 2 squirts in each nostril every 6 hours, as needed for runny nose and postnasal drip.  Use the Tessalon Perles every 8 hours during the day.  Take them with a small sip of water.  They may give you some numbness to the base of your tongue or a metallic taste in your mouth, this is normal.  Use the Promethazine DM cough syrup at bedtime for cough and congestion.  It will make you drowsy so do not take it during the day.  Use OTC Tylenol and Ibuprofen as needed for aches and fever.   Return for reevaluation or see your primary care provider for any new or worsening symptoms.

## 2021-07-27 NOTE — ED Triage Notes (Signed)
Nasal congestion, cough, fever, vomiting/diarrhea x 3 days.

## 2021-07-27 NOTE — ED Provider Notes (Signed)
[facility]    CSN: [id number] Arrival date & time: 07/27/21  1618      History   Chief Complaint Chief Complaint  Patient presents with   Nasal Congestion   Cough   Fever    HPI Sarah Harvey is a 25 y.o. female.   HPI  25 year old female here for evaluation of respiratory complaints.  Patient reports that for the last days she has been experiencing fever with a T-max of 100.7, alternating runny nose and nasal congestion, intermittently productive cough with shortness of breath and wheezing, body aches, headache, vomiting, and diarrhea.  She states that her son has similar symptoms to hers and he tested negative for COVID and influenza.  He is 26 years old so they did not test him for RSV.  She denies ear pain or sore throat.  Past Medical History:  Diagnosis Date   Asthma    Depression    GERD (gastroesophageal reflux disease)    History of migraine    POTS (postural orthostatic tachycardia syndrome)    Seizures ([place])    Syncope     Patient Active Problem List   Diagnosis Date Noted   Thrombocytosis 05/29/2019   POTS (postural orthostatic tachycardia syndrome) 12/15/2018   History of migraine 12/15/2018   Asthma 12/15/2018   Mild depression 12/15/2018   GERD (gastroesophageal reflux disease) 12/15/2018   Fatigue 12/11/2018    Past Surgical History:  Procedure Laterality Date   WRIST SURGERY      OB History   No obstetric history on file.      Home Medications    Prior to Admission medications   Medication Sig Start Date End Date Taking? Authorizing Provider  benzonatate (TESSALON) 100 MG capsule Take 2 capsules (200 mg total) by mouth every 8 (eight) hours. 07/27/21  Yes Margarette Canada, NP  ipratropium (ATROVENT) 0.06 % nasal spray Place 2 sprays into both nostrils 4 (four) times daily. 07/27/21  Yes Margarette Canada, NP  promethazine-dextromethorphan (PROMETHAZINE-DM) 6.25-15 MG/5ML syrup Take 5 mLs by mouth 4 (four) times daily as  needed. 07/27/21  Yes Margarette Canada, NP  albuterol (PROVENTIL HFA;VENTOLIN HFA) 108 (90 Base) MCG/ACT inhaler Inhale 2 puffs into the lungs every 4 (four) hours as needed for wheezing. 12/13/17   Marylene Land, NP  etodolac (LODINE) 400 MG tablet Take 1 tablet (400 mg total) by mouth 2 (two) times daily. 05/02/21   Hughie Closs, PA-C  sertraline (ZOLOFT) 100 MG tablet TAKE 1 TABLET BY MOUTH EVERY DAY 05/18/21   Einar Pheasant, MD  TRI-LO-MARZIA 0.18/0.215/0.25 MG-25 MCG tab TAKE 1 TABLET BY MOUTH EVERY DAY 07/26/21   Einar Pheasant, MD    Family History Family History  Problem Relation Age of Onset   Arthritis Mother    Anxiety disorder Mother    9 / [place] Mother    Alcohol abuse Father    Depression Father    Drug abuse Father    Diabetes Father    Hyperlipidemia Father    Hypertension Father    Arthritis Maternal Grandmother    Depression Maternal Grandmother    Hyperlipidemia Maternal Grandmother    Hypertension Maternal Grandmother    Learning disabilities Maternal Grandmother    Cancer Maternal Grandfather    Heart disease Maternal Grandfather    Hyperlipidemia Maternal Grandfather    Cancer Paternal Grandfather     Social History Social History   Tobacco Use   Smoking status: Never   Smokeless tobacco: Never  Vaping Use  Vaping Use: Never used  Substance Use Topics   Alcohol use: Yes    Comment: every now and then   Drug use: No     Allergies   Sulfa antibiotics   Review of Systems Review of Systems  Constitutional:  Positive for fever. Negative for activity change and appetite change.  HENT:  Positive for congestion and rhinorrhea. Negative for ear pain and sore throat.   Respiratory:  Positive for cough, shortness of breath and wheezing.   Gastrointestinal:  Positive for diarrhea, nausea and vomiting.  Musculoskeletal:  Positive for arthralgias and myalgias.  Skin:  Negative for rash.  Neurological:  Positive for headaches.   Hematological: Negative.   Psychiatric/Behavioral: Negative.      Physical Exam Triage Vital Signs ED Triage Vitals  Enc Vitals Group     BP 07/27/21 1713 127/89     Pulse Rate 07/27/21 1713 85     Resp 07/27/21 1713 16     Temp 07/27/21 1713 98.2 F (36.8 C)     Temp Source 07/27/21 1713 Oral     SpO2 07/27/21 1713 96 %     Weight --      Height --      Head Circumference --      Peak Flow --      Pain Score 07/27/21 1711 0     Pain Loc --      Pain Edu? --      Excl. in [place]? --    No data found.  Updated Vital Signs BP 127/89 (BP Location: Right Arm)   Pulse 85   Temp 98.2 F (36.8 C) (Oral)   Resp 16   LMP 07/25/2021 (Exact Date)   SpO2 96%   Visual Acuity Right Eye Distance:   Left Eye Distance:   Bilateral Distance:    Right Eye Near:   Left Eye Near:    Bilateral Near:     Physical Exam Vitals and nursing note reviewed.  Constitutional:      General: She is not in acute distress.    Appearance: Normal appearance. She is normal weight. She is not ill-appearing.  HENT:     Head: Normocephalic and atraumatic.     Right Ear: Tympanic membrane, ear canal and external ear normal. There is no impacted cerumen.     Left Ear: Tympanic membrane, ear canal and external ear normal. There is no impacted cerumen.     Nose: Congestion and rhinorrhea present.     Mouth/Throat:     Mouth: Mucous membranes are moist.     Pharynx: Oropharynx is clear. Posterior oropharyngeal erythema present.  Cardiovascular:     Rate and Rhythm: Normal rate and regular rhythm.     Pulses: Normal pulses.     Heart sounds: Normal heart sounds. No murmur heard.   No gallop.  Pulmonary:     Effort: Pulmonary effort is normal.     Breath sounds: Normal breath sounds. No wheezing, rhonchi or rales.  Musculoskeletal:     Cervical back: Normal range of motion and neck supple.  Lymphadenopathy:     Cervical: No cervical adenopathy.  Skin:    General: Skin is warm and dry.      Capillary Refill: Capillary refill takes less than 2 seconds.     Findings: No erythema or rash.  Neurological:     General: No focal deficit present.     Mental Status: She is alert and oriented to person, place, and time.  Psychiatric:        Mood and Affect: Mood normal.        Behavior: Behavior normal.        Thought Content: Thought content normal.        Judgment: Judgment normal.     UC Treatments / Results  Labs (all labs ordered are listed, but only abnormal results are displayed) Labs Reviewed - No data to display  EKG   Radiology No results found.  Procedures Procedures (including critical care time)  Medications Ordered in UC Medications - No data to display  Initial Impression / Assessment and Plan / UC Course  I have reviewed the triage vital signs and the nursing notes.  Pertinent labs & imaging results that were available during my care of the patient were reviewed by me and considered in my medical decision making (see chart for details).  Patient is a nontoxic-appearing 34 old female here for evaluation of respiratory and GI complaints as outlined in HPI above.  Patient's physical exam reveals pearly gray tympanic membranes bilaterally with normal light reflex and clear external auditory canals.  Nasal mucosa is erythematous and edematous with scant clear nasal discharge.  Oropharyngeal exam reveals posterior oropharyngeal erythema with clear postnasal drip.  No cervical lymphadenopathy appreciated on exam.  Cardiopulmonary exam reveals clear lung sounds in all fields.  Patient exam is consistent with a viral URI with a cough.  We will treat with Atrovent nasal spray, Tessalon Perles, and Promethazine DM cough syrup.  Work note provided.   Final Clinical Impressions(s) / UC Diagnoses   Final diagnoses:  Viral URI with cough     Discharge Instructions      Use the Atrovent nasal spray, 2 squirts in each nostril every 6 hours, as needed for runny nose  and postnasal drip.  Use the Tessalon Perles every 8 hours during the day.  Take them with a small sip of water.  They may give you some numbness to the base of your tongue or a metallic taste in your mouth, this is normal.  Use the Promethazine DM cough syrup at bedtime for cough and congestion.  It will make you drowsy so do not take it during the day.  Use OTC Tylenol and Ibuprofen as needed for aches and fever.   Return for reevaluation or see your primary care provider for any new or worsening symptoms.      ED Prescriptions     Medication Sig Dispense Auth. Provider   benzonatate (TESSALON) 100 MG capsule Take 2 capsules (200 mg total) by mouth every 8 (eight) hours. 21 capsule Margarette Canada, NP   ipratropium (ATROVENT) 0.06 % nasal spray Place 2 sprays into both nostrils 4 (four) times daily. 15 mL Margarette Canada, NP   promethazine-dextromethorphan (PROMETHAZINE-DM) 6.25-15 MG/5ML syrup Take 5 mLs by mouth 4 (four) times daily as needed. 118 mL Margarette Canada, NP      PDMP not reviewed this encounter.   Margarette Canada, NP 07/27/21 [id number]

## 2021-07-30 ENCOUNTER — Other Ambulatory Visit: Payer: Self-pay | Admitting: Internal Medicine

## 2021-09-09 ENCOUNTER — Other Ambulatory Visit: Payer: Self-pay

## 2021-09-09 ENCOUNTER — Ambulatory Visit
Admission: EM | Admit: 2021-09-09 | Discharge: 2021-09-09 | Disposition: A | Discharge status: Home / Self Care | Payer: Self-pay | Attending: Emergency Medicine | Admitting: Emergency Medicine

## 2021-09-09 ENCOUNTER — Ambulatory Visit: Payer: Self-pay

## 2021-09-09 DIAGNOSIS — J069 Acute upper respiratory infection, unspecified: Secondary | ICD-10-CM

## 2021-09-09 MED ORDER — ALBUTEROL SULFATE HFA 108 (90 BASE) MCG/ACT IN AERS: 2.0000 | INHALATION_SPRAY | RESPIRATORY_TRACT | 0 refills | Status: DC | PRN

## 2021-09-09 MED ORDER — PROMETHAZINE-DM 6.25-15 MG/5ML PO SYRP: 5.0000 mL | ORAL_SOLUTION | Freq: Four times a day (QID) | ORAL | 0 refills | Status: DC | PRN

## 2021-09-09 MED ORDER — BENZONATATE 100 MG PO CAPS: 200.0000 mg | ORAL_CAPSULE | Freq: Three times a day (TID) | ORAL | 0 refills | Status: DC

## 2021-09-09 NOTE — ED Triage Notes (Signed)
Pt c/o chest congestion, SOB, cough, nasal congestion, diarrhea x5-6days. Pt took an at home covid test on 09/08/21 and it was negative.

## 2021-09-09 NOTE — Discharge Instructions (Signed)
Your symptoms today are most likely being caused by a virus and should steadily improve in time it can take up to 7 to 10 days before you truly start to see a turnaround however things will get better    You may use your albuterol inhaler taking 2 puffs every 4 hours for chest tightness, wheezing or shortness of breath, it is normal if you have a history of asthma that it may become flared during times of illness  You may use Tessalon pill every 8 hours to help calm coughing  You may use cough syrup up to 4 times a day to help calm coughing, be mindful of this medication make you drowsy  You can take Tylenol and/or Ibuprofen as needed for fever reduction and pain relief.   For cough: honey 1/2 to 1 teaspoon (you can dilute the honey in water or another fluid).  You can also use guaifenesin and dextromethorphan for cough. You can use a humidifier for chest congestion and cough.  If you don't have a humidifier, you can sit in the bathroom with the hot shower running.      For sore throat: try warm salt water gargles, cepacol lozenges, throat spray, warm tea or water with lemon/honey, popsicles or ice, or OTC cold relief medicine for throat discomfort.   For congestion: take a daily anti-histamine like Zyrtec, Claritin, and a oral decongestant, such as pseudoephedrine.  You can also use Flonase 1-2 sprays in each nostril daily.   It is important to stay hydrated: drink plenty of fluids (water, gatorade/powerade/pedialyte, juices, or teas) to keep your throat moisturized and help further relieve irritation/discomfort.

## 2021-09-09 NOTE — ED Provider Notes (Signed)
[facility]    CSN: [id number] Arrival date & time: 09/09/21  1047      History   Chief Complaint Chief Complaint  Patient presents with   Cough   Diarrhea    HPI Ulonda Klosowski is a 25 y.o. female.   Patient presents with fever, nasal congestion, rhinorrhea, sore throat, nonproductive cough, increased shortness of breath, wheezing and chest tightness for 6 days. Has attempted use of mucinex which ws not helpful. Possible sick contatcs.  Home COVID test negative history of asthma, seizuresd,  GERD, migraines .   Past Medical History:  Diagnosis Date   Asthma    Depression    GERD (gastroesophageal reflux disease)    History of migraine    POTS (postural orthostatic tachycardia syndrome)    Seizures ([place])    Syncope     Patient Active Problem List   Diagnosis Date Noted   Thrombocytosis 05/29/2019   POTS (postural orthostatic tachycardia syndrome) 12/15/2018   History of migraine 12/15/2018   Asthma 12/15/2018   Mild depression 12/15/2018   GERD (gastroesophageal reflux disease) 12/15/2018   Fatigue 12/11/2018    Past Surgical History:  Procedure Laterality Date   WRIST SURGERY      OB History   No obstetric history on file.      Home Medications    Prior to Admission medications   Medication Sig Start Date End Date Taking? Authorizing Provider  sertraline (ZOLOFT) 100 MG tablet TAKE 1 TABLET BY MOUTH EVERY DAY 07/30/21  Yes Einar Pheasant, MD  albuterol (PROVENTIL HFA;VENTOLIN HFA) 108 (90 Base) MCG/ACT inhaler Inhale 2 puffs into the lungs every 4 (four) hours as needed for wheezing. 12/13/17   Marylene Land, NP  benzonatate (TESSALON) 100 MG capsule Take 2 capsules (200 mg total) by mouth every 8 (eight) hours. 07/27/21   Margarette Canada, NP  etodolac (LODINE) 400 MG tablet Take 1 tablet (400 mg total) by mouth 2 (two) times daily. 05/02/21   Hughie Closs, PA-C  ipratropium (ATROVENT) 0.06 % nasal spray Place 2 sprays into both  nostrils 4 (four) times daily. 07/27/21   Margarette Canada, NP  promethazine-dextromethorphan (PROMETHAZINE-DM) 6.25-15 MG/5ML syrup Take 5 mLs by mouth 4 (four) times daily as needed. 07/27/21   Margarette Canada, NP  TRI-LO-MARZIA 0.18/0.215/0.25 MG-25 MCG tab TAKE 1 TABLET BY MOUTH EVERY DAY 07/26/21   Einar Pheasant, MD    Family History Family History  Problem Relation Age of Onset   Arthritis Mother    Anxiety disorder Mother    Miscarriages / [place] Mother    Alcohol abuse Father    Depression Father    Drug abuse Father    Diabetes Father    Hyperlipidemia Father    Hypertension Father    Arthritis Maternal Grandmother    Depression Maternal Grandmother    Hyperlipidemia Maternal Grandmother    Hypertension Maternal Grandmother    Learning disabilities Maternal Grandmother    Cancer Maternal Grandfather    Heart disease Maternal Grandfather    Hyperlipidemia Maternal Grandfather    Cancer Paternal Grandfather     Social History Social History   Tobacco Use   Smoking status: Never   Smokeless tobacco: Never  Vaping Use   Vaping Use: Never used  Substance Use Topics   Alcohol use: Yes    Comment: every now and then   Drug use: No     Allergies   Sulfa antibiotics   Review of Systems Review of Systems  Constitutional:  Positive for fever. Negative for activity change, appetite change, chills, diaphoresis, fatigue and unexpected weight change.  HENT:  Positive for congestion, rhinorrhea and sore throat. Negative for dental problem, drooling, ear discharge, ear pain, facial swelling, hearing loss, mouth sores, nosebleeds, postnasal drip, sinus pressure, sinus pain, sneezing, tinnitus, trouble swallowing and voice change.   Respiratory:  Positive for cough, chest tightness, shortness of breath and wheezing. Negative for apnea, choking and stridor.   Cardiovascular: Negative.   Gastrointestinal:  Positive for diarrhea. Negative for abdominal distention, abdominal  pain, anal bleeding, blood in stool, constipation, nausea, rectal pain and vomiting.  Skin: Negative.   Neurological: Negative.     Physical Exam Triage Vital Signs ED Triage Vitals  Enc Vitals Group     BP 09/09/21 1157 128/87     Pulse Rate 09/09/21 1157 99     Resp 09/09/21 1157 18     Temp 09/09/21 1157 98.7 F (37.1 C)     Temp Source 09/09/21 1157 Oral     SpO2 09/09/21 1157 99 %     Weight 09/09/21 1155 200 lb (90.7 kg)     Height 09/09/21 1155 5\' 3"  (1.6 m)     Head Circumference --      Peak Flow --      Pain Score 09/09/21 1154 6     Pain Loc --      Pain Edu? --      Excl. in [place]? --    No data found.  Updated Vital Signs BP 128/87 (BP Location: Left Arm)    Pulse 99    Temp 98.7 F (37.1 C) (Oral)    Resp 18    Ht 5\' 3"  (1.6 m)    Wt 200 lb (90.7 kg)    LMP 08/24/2021    SpO2 99%    BMI 35.43 kg/m   Visual Acuity Right Eye Distance:   Left Eye Distance:   Bilateral Distance:    Right Eye Near:   Left Eye Near:    Bilateral Near:     Physical Exam Constitutional:      Appearance: Normal appearance. She is normal weight.  HENT:     Head: Normocephalic.     Right Ear: Tympanic membrane, ear canal and external ear normal.     Left Ear: Tympanic membrane, ear canal and external ear normal.     Nose: Congestion and rhinorrhea present.     Mouth/Throat:     Mouth: Mucous membranes are moist.     Pharynx: Posterior oropharyngeal erythema present.  Eyes:     Extraocular Movements: Extraocular movements intact.  Cardiovascular:     Rate and Rhythm: Normal rate and regular rhythm.     Pulses: Normal pulses.     Heart sounds: Normal heart sounds.  Pulmonary:     Effort: Pulmonary effort is normal.     Breath sounds: Normal breath sounds.  Musculoskeletal:     Cervical back: Normal range of motion.  Lymphadenopathy:     Cervical: Cervical adenopathy present.  Skin:    General: Skin is warm and dry.  Neurological:     Mental Status: She is alert and  oriented to person, place, and time. Mental status is at baseline.  Psychiatric:        Mood and Affect: Mood normal.        Behavior: Behavior normal.     UC Treatments / Results  Labs (all labs ordered are listed, but only abnormal  results are displayed) Labs Reviewed - No data to display  EKG   Radiology No results found.  Procedures Procedures (including critical care time)  Medications Ordered in UC Medications - No data to display  Initial Impression / Assessment and Plan / UC Course  I have reviewed the triage vital signs and the nursing notes.  Pertinent labs & imaging results that were available during my care of the patient were reviewed by me and considered in my medical decision making (see chart for details).  Viral URI with cough  Vital signs are stable, lungs clear on exam, O2 saturation 99% on room air, stable for outpatient management, will defer viral flu testing at this time as it will not change treatment plan, discussed with patient, in agreement with plan of care, albuterol inhaler prescribed for respiratory symptoms, Tessalon and Promethazine DM prescribed for coughing as this is patient's most concerning symptoms, over-the-counter medication for remaining symptom management, urgent care follow-up as needed Final Clinical Impressions(s) / UC Diagnoses   Final diagnoses:  None   Discharge Instructions   None    ED Prescriptions   None    PDMP not reviewed this encounter.   Hans Eden, NP 09/09/21 1223

## 2021-10-16 ENCOUNTER — Ambulatory Visit
Admission: RE | Admit: 2021-10-16 | Discharge: 2021-10-16 | Disposition: A | Discharge status: Home / Self Care | Payer: Self-pay | Source: Ambulatory Visit | Attending: Medical Oncology | Admitting: Medical Oncology

## 2021-10-16 ENCOUNTER — Other Ambulatory Visit: Payer: Self-pay

## 2021-10-16 VITALS — BP 114/71 | HR 91 | Temp 98.3°F | Resp 14 | Ht 63.0 in | Wt 200.0 lb

## 2021-10-16 DIAGNOSIS — J0140 Acute pansinusitis, unspecified: Secondary | ICD-10-CM

## 2021-10-16 DIAGNOSIS — R053 Chronic cough: Secondary | ICD-10-CM

## 2021-10-16 DIAGNOSIS — Z3202 Encounter for pregnancy test, result negative: Secondary | ICD-10-CM

## 2021-10-16 LAB — PREGNANCY, URINE: Preg Test, Ur: NEGATIVE

## 2021-10-16 MED ORDER — PREDNISONE 10 MG (21) PO TBPK: ORAL_TABLET | Freq: Every day | ORAL | 0 refills | Status: DC

## 2021-10-16 MED ORDER — AMOXICILLIN-POT CLAVULANATE 875-125 MG PO TABS: 1.0000 | ORAL_TABLET | Freq: Two times a day (BID) | ORAL | 0 refills | Status: DC

## 2021-10-16 MED ORDER — BENZONATATE 100 MG PO CAPS: 100.0000 mg | ORAL_CAPSULE | Freq: Three times a day (TID) | ORAL | 0 refills | Status: DC

## 2021-10-16 MED ORDER — FLUTICASONE PROPIONATE 50 MCG/ACT NA SUSP: 2.0000 | Freq: Every day | NASAL | 0 refills | Status: DC

## 2021-10-16 NOTE — ED Triage Notes (Signed)
Patient c/o cough and chest congestion for a week.  Patient also reports nasal congestion and sore throat.  Patient denies fevers.

## 2021-10-16 NOTE — ED Provider Notes (Signed)
[facility]    CSN: [id number] Arrival date & time: 10/16/21  0835      History   Chief Complaint Chief Complaint  Patient presents with   Cough    HPI Sarah Harvey is a 26 y.o. female.   HPI  Cough: Patient reports that she has had symptoms of sinus congestion, sinus headache, dry cough for the past week but notes that she has been sick off and on for the past 3 months.  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  She denies any recent fevers and has not had any shortness of breath or chest pain.  She has tried EmergenC OTC cold and flu medicine with some improvement. She has not had to use her inhaler. Of note she asks for a pregnancy test as her last period was in Dec.   Past Medical History:  Diagnosis Date   Asthma    Depression    GERD (gastroesophageal reflux disease)    History of migraine    POTS (postural orthostatic tachycardia syndrome)    Seizures ([place])    Syncope     Patient Active Problem List   Diagnosis Date Noted   Thrombocytosis 05/29/2019   POTS (postural orthostatic tachycardia syndrome) 12/15/2018   History of migraine 12/15/2018   Asthma 12/15/2018   Mild depression 12/15/2018   GERD (gastroesophageal reflux disease) 12/15/2018   Fatigue 12/11/2018    Past Surgical History:  Procedure Laterality Date   WRIST SURGERY      OB History   No obstetric history on file.      Home Medications    Prior to Admission medications   Medication Sig Start Date End Date Taking? Authorizing Provider  albuterol (VENTOLIN HFA) 108 (90 Base) MCG/ACT inhaler Inhale 2 puffs into the lungs every 4 (four) hours as needed for wheezing. 09/09/21   White, Leitha Schuller, NP  benzonatate (TESSALON) 100 MG capsule Take 2 capsules (200 mg total) by mouth every 8 (eight) hours. 09/09/21   Hans Eden, NP  promethazine-dextromethorphan (PROMETHAZINE-DM) 6.25-15 MG/5ML syrup Take 5 mLs  by mouth 4 (four) times daily as needed. 09/09/21   Hans Eden, NP  sertraline (ZOLOFT) 100 MG tablet TAKE 1 TABLET BY MOUTH EVERY DAY 07/30/21   Einar Pheasant, MD    Family History Family History  Problem Relation Age of Onset   Arthritis Mother    Anxiety disorder Mother    Miscarriages / [place] Mother    Alcohol abuse Father    Depression Father    Drug abuse Father    Diabetes Father    Hyperlipidemia Father    Hypertension Father    Arthritis Maternal Grandmother    Depression Maternal Grandmother    Hyperlipidemia Maternal Grandmother    Hypertension Maternal Grandmother    Learning disabilities Maternal Grandmother    Cancer Maternal Grandfather    Heart disease Maternal Grandfather    Hyperlipidemia Maternal Grandfather    Cancer Paternal Grandfather     Social History Social History   Tobacco Use   Smoking status: Never   Smokeless tobacco: Never  Vaping Use   Vaping Use: Never used  Substance Use Topics   Alcohol use: Yes    Comment: every now and then   Drug use: No     Allergies   Sulfa antibiotics   Review of Systems Review of Systems  As stated above in HPI Physical Exam Triage Vital  Signs ED Triage Vitals  Enc Vitals Group     BP 10/16/21 0911 114/71     Pulse Rate 10/16/21 0911 91     Resp 10/16/21 0911 14     Temp 10/16/21 0911 98.3 F (36.8 C)     Temp Source 10/16/21 0911 Oral     SpO2 10/16/21 0911 98 %     Weight 10/16/21 0909 200 lb (90.7 kg)     Height 10/16/21 0909 5\' 3"  (1.6 m)     Head Circumference --      Peak Flow --      Pain Score 10/16/21 0909 5     Pain Loc --      Pain Edu? --      Excl. in [place]? --    No data found.  Updated Vital Signs BP 114/71 (BP Location: Left Arm)    Pulse 91    Temp 98.3 F (36.8 C) (Oral)    Resp 14    Ht 5\' 3"  (1.6 m)    Wt 200 lb (90.7 kg)    LMP 08/27/2021    SpO2 98%    BMI 35.43 kg/m   Physical Exam Vitals and nursing note reviewed.  Constitutional:       General: She is not in acute distress.    Appearance: Normal appearance. She is not ill-appearing, toxic-appearing or diaphoretic.  HENT:     Head: Normocephalic and atraumatic.     Right Ear: Tympanic membrane normal.     Left Ear: Tympanic membrane normal.     Nose: Congestion (Maxillary and frontal sinus bogginess) and rhinorrhea present.     Mouth/Throat:     Mouth: Mucous membranes are moist.     Pharynx: Oropharynx is clear. No oropharyngeal exudate or posterior oropharyngeal erythema.  Eyes:     General:        Right eye: No discharge.        Left eye: No discharge.     Extraocular Movements: Extraocular movements intact.     Conjunctiva/sclera: Conjunctivae normal.     Pupils: Pupils are equal, round, and reactive to light.  Cardiovascular:     Rate and Rhythm: Normal rate and regular rhythm.     Heart sounds: Normal heart sounds.  Pulmonary:     Effort: Pulmonary effort is normal. No respiratory distress.     Breath sounds: No stridor. Rhonchi (scant right lung field) present. No wheezing.  Abdominal:     Palpations: Abdomen is soft.  Musculoskeletal:     Cervical back: Normal range of motion and neck supple.  Lymphadenopathy:     Cervical: No cervical adenopathy.  Skin:    General: Skin is warm.     Coloration: Skin is not jaundiced.     Findings: No erythema or rash.  Neurological:     Mental Status: She is alert and oriented to person, place, and time.     UC Treatments / Results  Labs (all labs ordered are listed, but only abnormal results are displayed) Labs Reviewed  PREGNANCY, URINE    EKG   Radiology No results found.  Procedures Procedures (including critical care time)  Medications Ordered in UC Medications - No data to display  Initial Impression / Assessment and Plan / UC Course  I have reviewed the triage vital signs and the nursing notes.  Pertinent labs & imaging results that were available during my care of the patient were reviewed  by me and considered in my medical  decision making (see chart for details).     New.  Treating for acute sinusitis and chronic cough with Augmentin, Flonase, prednisone, Tessalon.  She will use her home inhaler as needed. Discussed red flag signs and symptoms. Follow up as needed.  Final Clinical Impressions(s) / UC Diagnoses   Final diagnoses:  None   Discharge Instructions   None    ED Prescriptions   None    PDMP not reviewed this encounter.   Hughie Closs, [place] 10/16/21 0930

## 2021-11-12 ENCOUNTER — Ambulatory Visit: Payer: Self-pay

## 2021-12-22 ENCOUNTER — Ambulatory Visit
Admission: RE | Admit: 2021-12-22 | Discharge: 2021-12-22 | Disposition: A | Discharge status: Home / Self Care | Payer: Self-pay | Source: Ambulatory Visit

## 2021-12-22 ENCOUNTER — Ambulatory Visit: Payer: Self-pay

## 2021-12-22 VITALS — BP 132/60 | HR 98 | Temp 99.5°F | Resp 18 | Ht 63.0 in | Wt 200.0 lb

## 2021-12-22 DIAGNOSIS — J0120 Acute ethmoidal sinusitis, unspecified: Secondary | ICD-10-CM

## 2021-12-22 MED ORDER — AMOXICILLIN-POT CLAVULANATE 875-125 MG PO TABS: 1.0000 | ORAL_TABLET | Freq: Two times a day (BID) | ORAL | 0 refills | Status: AC

## 2021-12-22 NOTE — ED Provider Notes (Signed)
?[facility] ? ? ? ?CSN: [id number] ?Arrival date & time: 12/22/21  1542 ? ? ?  ? ?History   ?Chief Complaint ?Chief Complaint  ?Patient presents with  ? Nasal Congestion  ? ? ?HPI ?Sarah Harvey is a 26 y.o. female.  ? ?Pt complains of sinus pressure and nasal congestion for 1 week.  Pt has history of sinus infections  ? ?The history is provided by the patient. No language interpreter was used.  ?Cough ?Cough characteristics:  Non-productive ?Sputum characteristics:  Nondescript ?Severity:  Moderate ?Onset quality:  Gradual ?Duration:  6 days ?Timing:  Constant ?Progression:  Worsening ?Chronicity:  New ?Relieved by:  Nothing ?Worsened by:  Nothing ? ?Past Medical History:  ?Diagnosis Date  ? Asthma   ? Depression   ? GERD (gastroesophageal reflux disease)   ? History of migraine   ? POTS (postural orthostatic tachycardia syndrome)   ? Seizures ([place])   ? Syncope   ? ? ?Patient Active Problem List  ? Diagnosis Date Noted  ? Thrombocytosis 05/29/2019  ? POTS (postural orthostatic tachycardia syndrome) 12/15/2018  ? History of migraine 12/15/2018  ? Asthma 12/15/2018  ? Mild depression 12/15/2018  ? GERD (gastroesophageal reflux disease) 12/15/2018  ? Fatigue 12/11/2018  ? Partial scapholunate tear 08/06/2013  ? Contusion of unspecified wrist, initial encounter 07/09/2013  ? Sprain of wrist 07/09/2013  ? Wrist pain 07/09/2013  ? Syncope and collapse 06/14/2013  ? Tachycardia 06/14/2013  ? ? ?Past Surgical History:  ?Procedure Laterality Date  ? WRIST SURGERY    ? ? ?OB History   ?No obstetric history on file. ?  ? ? ? ?Home Medications   ? ?Prior to Admission medications   ?Medication Sig Start Date End Date Taking? Authorizing Provider  ?albuterol (VENTOLIN HFA) 108 (90 Base) MCG/ACT inhaler Inhale 2 puffs into the lungs every 4 (four) hours as needed for wheezing. 09/09/21  Yes Hans Eden, NP  ?amoxicillin-clavulanate (AUGMENTIN) 875-125 MG tablet Take 1 tablet by mouth 2 (two) times daily  for 10 days. 12/22/21 01/01/22 Yes Fransico Meadow, PA-C  ?Norgestimate-Ethinyl Estradiol Triphasic 0.18/0.215/0.25 MG-25 MCG tab Take 1 tablet by mouth daily. 01/20/20  Yes [provider]  ?fluticasone (FLONASE) 50 MCG/ACT nasal spray Place 2 sprays into both nostrils daily. 10/16/21   Hughie Closs, PA-C  ?Norgestimate-Ethinyl Estradiol Triphasic 0.18/0.215/0.25 MG-25 MCG tab Tri-Lo-Marzia 0.18 mg/0.215 mg/0.25 mg-25 mcg tablet ? TAKE 1 TABLET BY MOUTH EVERY DAY    [provider]  ? ? ?Family History ?Family History  ?Problem Relation Age of Onset  ? Arthritis Mother   ? Anxiety disorder Mother   ? Miscarriages / [place] Mother   ? Alcohol abuse Father   ? Depression Father   ? Drug abuse Father   ? Diabetes Father   ? Hyperlipidemia Father   ? Hypertension Father   ? Arthritis Maternal Grandmother   ? Depression Maternal Grandmother   ? Hyperlipidemia Maternal Grandmother   ? Hypertension Maternal Grandmother   ? Learning disabilities Maternal Grandmother   ? Cancer Maternal Grandfather   ? Heart disease Maternal Grandfather   ? Hyperlipidemia Maternal Grandfather   ? Cancer Paternal Grandfather   ? ? ?Social History ?Social History  ? ?Tobacco Use  ? Smoking status: Never  ? Smokeless tobacco: Never  ?Vaping Use  ? Vaping Use: Never used  ?Substance Use Topics  ? Alcohol use: Yes  ?  Comment: every now and then  ? Drug use: No  ? ? ? ?  Allergies   ?Sulfa antibiotics ? ? ?Review of Systems ?Review of Systems  ?Respiratory:  Positive for cough.   ?All other systems reviewed and are negative. ? ? ?Physical Exam ?Triage Vital Signs ?ED Triage Vitals  ?Enc Vitals Group  ?   BP 12/22/21 1553 132/60  ?   Pulse Rate 12/22/21 1553 98  ?   Resp 12/22/21 1553 18  ?   Temp 12/22/21 1553 99.5 ?F (37.5 ?C)  ?   Temp Source 12/22/21 1553 Oral  ?   SpO2 12/22/21 1553 100 %  ?   Weight 12/22/21 1551 200 lb (90.7 kg)  ?   Height 12/22/21 1551 '5\' 3"'$  (1.6 m)  ?   Head Circumference --   ?   Peak Flow --   ?    Pain Score 12/22/21 1550 0  ?   Pain Loc --   ?   Pain Edu? --   ?   Excl. in [place]? --   ? ?No data found. ? ?Updated Vital Signs ?BP 132/60 (BP Location: Left Arm)   Pulse 98   Temp 99.5 ?F (37.5 ?C) (Oral)   Resp 18   Ht '5\' 3"'$  (1.6 m)   Wt 90.7 kg   LMP 08/23/2021   SpO2 100%   BMI 35.43 kg/m?  ? ?Visual Acuity ?Right Eye Distance:   ?Left Eye Distance:   ?Bilateral Distance:   ? ?Right Eye Near:   ?Left Eye Near:    ?Bilateral Near:    ? ?Physical Exam ?Constitutional:   ?   Appearance: She is well-developed.  ?HENT:  ?   Head: Normocephalic and atraumatic.  ?   Mouth/Throat:  ?   Pharynx: Posterior oropharyngeal erythema present.  ?Eyes:  ?   Conjunctiva/sclera: Conjunctivae normal.  ?   Pupils: Pupils are equal, round, and reactive to light.  ?Pulmonary:  ?   Effort: Pulmonary effort is normal.  ?Abdominal:  ?   Palpations: Abdomen is soft.  ?Musculoskeletal:     ?   General: Normal range of motion.  ?   Cervical back: Normal range of motion and neck supple.  ?Skin: ?   General: Skin is warm and dry.  ?Neurological:  ?   Mental Status: She is alert and oriented to person, place, and time.  ? ? ? ?UC Treatments / Results  ?Labs ?(all labs ordered are listed, but only abnormal results are displayed) ?Labs Reviewed - No data to display ? ?EKG ? ? ?Radiology ?No results found. ? ?Procedures ?Procedures (including critical care time) ? ?Medications Ordered in UC ?Medications - No data to display ? ?Initial Impression / Assessment and Plan / UC Course  ?I have reviewed the triage vital signs and the nursing notes. ? ?Pertinent labs & imaging results that were available during my care of the patient were reviewed by me and considered in my medical decision making (see chart for details). ? ?  ? ? ?Final Clinical Impressions(s) / UC Diagnoses  ? ?Final diagnoses:  ?Acute ethmoidal sinusitis, recurrence not specified  ? ?Discharge Instructions   ?None ?  ? ?ED Prescriptions   ? ? Medication Sig Dispense Auth.  Provider  ? amoxicillin-clavulanate (AUGMENTIN) 875-125 MG tablet Take 1 tablet by mouth 2 (two) times daily for 10 days. 20 tablet Fransico Meadow, [place]  ? ?  ? ?PDMP not reviewed this encounter. ?  ?Fransico Meadow, PA-C ?12/22/21 1655 ? ?

## 2021-12-22 NOTE — ED Triage Notes (Signed)
Pt here with C/O sinus pressure and nasal congestion for 6 days. No fever. Has pressure in face.  ?

## 2021-12-27 ENCOUNTER — Ambulatory Visit: Payer: Self-pay

## 2022-01-05 ENCOUNTER — Ambulatory Visit: Payer: Self-pay

## 2022-01-25 ENCOUNTER — Ambulatory Visit (INDEPENDENT_AMBULATORY_CARE_PROVIDER_SITE_OTHER): Payer: Commercial Managed Care - PPO | Admitting: Internal Medicine

## 2022-01-25 ENCOUNTER — Encounter: Payer: Self-pay | Admitting: Internal Medicine

## 2022-01-25 DIAGNOSIS — J45909 Unspecified asthma, uncomplicated: Secondary | ICD-10-CM | POA: Diagnosis not present

## 2022-01-25 DIAGNOSIS — G90A Postural orthostatic tachycardia syndrome (POTS): Secondary | ICD-10-CM | POA: Diagnosis not present

## 2022-01-25 DIAGNOSIS — D75839 Thrombocytosis, unspecified: Secondary | ICD-10-CM

## 2022-01-25 DIAGNOSIS — N926 Irregular menstruation, unspecified: Secondary | ICD-10-CM | POA: Diagnosis not present

## 2022-01-25 LAB — CBC WITH DIFFERENTIAL/PLATELET
Basophils Absolute: 0.1 10*3/uL (ref 0.0–0.1)
Basophils Relative: 0.8 % (ref 0.0–3.0)
Eosinophils Absolute: 0.1 10*3/uL (ref 0.0–0.7)
Eosinophils Relative: 1.3 % (ref 0.0–5.0)
HCT: 40.2 % (ref 36.0–46.0)
Hemoglobin: 13.5 g/dL (ref 12.0–15.0)
Lymphocytes Relative: 26 % (ref 12.0–46.0)
Lymphs Abs: 1.9 10*3/uL (ref 0.7–4.0)
MCHC: 33.7 g/dL (ref 30.0–36.0)
MCV: 91.8 fl (ref 78.0–100.0)
Monocytes Absolute: 0.5 10*3/uL (ref 0.1–1.0)
Monocytes Relative: 6.3 % (ref 3.0–12.0)
Neutro Abs: 4.8 10*3/uL (ref 1.4–7.7)
Neutrophils Relative %: 65.6 % (ref 43.0–77.0)
Platelets: 422 10*3/uL — ABNORMAL HIGH (ref 150.0–400.0)
RBC: 4.37 Mil/uL (ref 3.87–5.11)
RDW: 13 % (ref 11.5–15.5)
WBC: 7.3 10*3/uL (ref 4.0–10.5)

## 2022-01-25 LAB — COMPREHENSIVE METABOLIC PANEL
ALT: 18 U/L (ref 0–35)
AST: 15 U/L (ref 0–37)
Albumin: 4 g/dL (ref 3.5–5.2)
Alkaline Phosphatase: 105 U/L (ref 39–117)
BUN: 15 mg/dL (ref 6–23)
CO2: 28 mEq/L (ref 19–32)
Calcium: 9.3 mg/dL (ref 8.4–10.5)
Chloride: 103 mEq/L (ref 96–112)
Creatinine, Ser: 0.84 mg/dL (ref 0.40–1.20)
GFR: 95.58 mL/min (ref >60.00)
Glucose, Bld: 116 mg/dL — ABNORMAL HIGH (ref 70–99)
Potassium: 4.3 mEq/L (ref 3.5–5.1)
Sodium: 139 mEq/L (ref 135–145)
Total Bilirubin: 0.4 mg/dL (ref 0.2–1.2)
Total Protein: 6.8 g/dL (ref 6.0–8.3)

## 2022-01-25 LAB — TSH: TSH: 1.22 u[IU]/mL (ref 0.35–5.50)

## 2022-01-25 NOTE — Progress Notes (Signed)
Patient ID: Sarah Harvey, female   DOB: [birth year], 26 y.o.   MRN: [id number]   Subjective:    Patient ID: Sarah Harvey, female    DOB: 15-Nov-1995, 26 y.o.   MRN: [id number]  Patient here for a scheduled follow up.   Chief Complaint  Patient presents with   Follow-up   .   HPI She is accompanied by her husband. History obtained from both of them.  Here to discuss concerns regarding her menstrual period.  Stopped taking ocp's 06/2021.  No period since 08/2021.  States end of January - minimal spotting.  Occasional cramping.  Occasional nausea.  Tries to stay active.  Trying to get pregnant.  Has taken home pregnancy tests.  Concern regarding no period.  Request gyn referral.  No chest pain or sob reported.  No abdominal pain or bowel change reported.     Past Medical History:  Diagnosis Date   Asthma    Depression    GERD (gastroesophageal reflux disease)    History of migraine    POTS (postural orthostatic tachycardia syndrome)    Seizures ([facility])    Syncope    Past Surgical History:  Procedure Laterality Date   WRIST SURGERY     Family History  Problem Relation Age of Onset   Arthritis Mother    Anxiety disorder Mother    15 / [place] Mother    Alcohol abuse Father    Depression Father    Drug abuse Father    Diabetes Father    Hyperlipidemia Father    Hypertension Father    Arthritis Maternal Grandmother    Depression Maternal Grandmother    Hyperlipidemia Maternal Grandmother    Hypertension Maternal Grandmother    Learning disabilities Maternal Grandmother    Cancer Maternal Grandfather    Heart disease Maternal Grandfather    Hyperlipidemia Maternal Grandfather    Cancer Paternal Grandfather    Social History   Socioeconomic History   Marital status: Single    Spouse name: Not on file   Number of children: Not on file   Years of education: Not on file   Highest education level: Not on file  Occupational History   Not on file   Tobacco Use   Smoking status: Never   Smokeless tobacco: Never  Vaping Use   Vaping Use: Never used  Substance and Sexual Activity   Alcohol use: Yes    Comment: every now and then   Drug use: No   Sexual activity: Yes    Birth control/protection: Condom, Pill  Other Topics Concern   Not on file  Social History Narrative   ** Merged History Encounter **       ** Merged History Encounter **       Social Determinants of Health   Financial Resource Strain: Not on file  Food Insecurity: Not on file  Transportation Needs: Not on file  Physical Activity: Not on file  Stress: Not on file  Social Connections: Not on file     Review of Systems  Constitutional:  Negative for appetite change and unexpected weight change.  HENT:  Negative for congestion and sinus pressure.   Respiratory:  Negative for cough, chest tightness and shortness of breath.   Cardiovascular:  Negative for chest pain, palpitations and leg swelling.  Gastrointestinal:  Negative for abdominal pain, diarrhea, nausea and vomiting.  Genitourinary:  Negative for difficulty urinating and dysuria.  Musculoskeletal:  Negative for joint swelling and myalgias.  Skin:  Negative for color change and rash.  Neurological:  Negative for dizziness, light-headedness and headaches.  Psychiatric/Behavioral:  Negative for agitation and dysphoric mood.       Objective:     BP 128/80 (BP Location: Left Arm, Patient Position: Sitting, Cuff Size: Large)   Pulse (!) 111   Temp 98.2 F (36.8 C) (Temporal)   Resp 20   Ht $R'5\' 3"'uo$  (1.6 m)   Wt 240 lb 9.6 oz (109.1 kg)   SpO2 99%   BMI 42.62 kg/m  Wt Readings from Last 3 Encounters:  01/25/22 240 lb 9.6 oz (109.1 kg)  12/22/21 200 lb (90.7 kg)  10/16/21 200 lb (90.7 kg)    Physical Exam Vitals reviewed.  Constitutional:      General: She is not in acute distress.    Appearance: Normal appearance.  HENT:     Head: Normocephalic and atraumatic.     Right Ear: External  ear normal.     Left Ear: External ear normal.  Eyes:     General: No scleral icterus.       Right eye: No discharge.        Left eye: No discharge.     Conjunctiva/sclera: Conjunctivae normal.  Neck:     Thyroid: No thyromegaly.  Cardiovascular:     Rate and Rhythm: Normal rate and regular rhythm.  Pulmonary:     Effort: No respiratory distress.     Breath sounds: Normal breath sounds. No wheezing.  Abdominal:     General: Bowel sounds are normal.     Palpations: Abdomen is soft.     Tenderness: There is no abdominal tenderness.  Musculoskeletal:        General: No swelling or tenderness.     Cervical back: Neck supple. No tenderness.  Lymphadenopathy:     Cervical: No cervical adenopathy.  Skin:    Findings: No erythema or rash.  Neurological:     Mental Status: She is alert.  Psychiatric:        Mood and Affect: Mood normal.        Behavior: Behavior normal.     Outpatient Encounter Medications as of 01/25/2022  Medication Sig   [DISCONTINUED] albuterol (VENTOLIN HFA) 108 (90 Base) MCG/ACT inhaler Inhale 2 puffs into the lungs every 4 (four) hours as needed for wheezing. (Patient not taking: Reported on 01/25/2022)   [DISCONTINUED] fluticasone (FLONASE) 50 MCG/ACT nasal spray Place 2 sprays into both nostrils daily. (Patient not taking: Reported on 01/25/2022)   [DISCONTINUED] Norgestimate-Ethinyl Estradiol Triphasic 0.18/0.215/0.25 MG-25 MCG tab Tri-Lo-Marzia 0.18 mg/0.215 mg/0.25 mg-25 mcg tablet  TAKE 1 TABLET BY MOUTH EVERY DAY (Patient not taking: Reported on 01/25/2022)   [DISCONTINUED] Norgestimate-Ethinyl Estradiol Triphasic 0.18/0.215/0.25 MG-25 MCG tab Take 1 tablet by mouth daily. (Patient not taking: Reported on 01/25/2022)   No facility-administered encounter medications on file as of 01/25/2022.     Lab Results  Component Value Date   WBC 7.3 01/25/2022   HGB 13.5 01/25/2022   HCT 40.2 01/25/2022   PLT 422.0 (H) 01/25/2022   GLUCOSE 116 (H) 01/25/2022    CHOL 120 05/29/2019   TRIG 133.0 05/29/2019   HDL 43.90 05/29/2019   LDLCALC 50 05/29/2019   ALT 18 01/25/2022   AST 15 01/25/2022   NA 139 01/25/2022   K 4.3 01/25/2022   CL 103 01/25/2022   CREATININE 0.84 01/25/2022   BUN 15 01/25/2022   CO2 28 01/25/2022   TSH 1.22 01/25/2022  Assessment & Plan:   Problem List Items Addressed This Visit     Asthma    Breathing stable.        Menstrual changes    Off ocp's.  No period since 08/2021.  Has taken home pregnancy tests.  Trying to get pregnant.  Will check cbc, met c and tsh.  Also check serum pregnancy test.  Discussed gyn referral.         Relevant Orders   CBC with Differential/Platelet (Completed)   TSH (Completed)   Comprehensive metabolic panel (Completed)   Beta hCG quant (ref lab) (Completed)   Ambulatory referral to Gynecology   POTS (postural orthostatic tachycardia syndrome)    Reports no recent problems.  Doing well.  Follow.        Thrombocytosis    Recheck cbc today.          Einar Pheasant, MD

## 2022-01-26 LAB — BETA HCG QUANT (REF LAB): hCG Quant: <1 m[IU]/mL

## 2022-01-27 ENCOUNTER — Other Ambulatory Visit: Payer: Self-pay

## 2022-01-27 ENCOUNTER — Telehealth: Payer: Self-pay

## 2022-01-27 DIAGNOSIS — D75839 Thrombocytosis, unspecified: Secondary | ICD-10-CM

## 2022-01-27 NOTE — Telephone Encounter (Signed)
I have ordered patient's CBC but was unable to reach her to schedule lab in 6 weeks. Pt has seen results on her mychart.

## 2022-01-28 ENCOUNTER — Telehealth: Payer: Commercial Managed Care - PPO

## 2022-02-06 ENCOUNTER — Encounter: Payer: Self-pay | Admitting: Internal Medicine

## 2022-02-06 NOTE — Assessment & Plan Note (Signed)
Breathing stable.

## 2022-02-06 NOTE — Assessment & Plan Note (Signed)
Recheck cbc today.   

## 2022-02-06 NOTE — Assessment & Plan Note (Signed)
Reports no recent problems.  Doing well.  Follow.

## 2022-02-06 NOTE — Assessment & Plan Note (Signed)
Off ocp's.  No period since 08/2021.  Has taken home pregnancy tests.  Trying to get pregnant.  Will check cbc, met c and tsh.  Also check serum pregnancy test.  Discussed gyn referral.

## 2022-02-28 ENCOUNTER — Encounter: Payer: Self-pay | Admitting: Physician Assistant

## 2022-02-28 ENCOUNTER — Telehealth: Payer: Self-pay

## 2022-02-28 ENCOUNTER — Telehealth: Payer: Self-pay | Admitting: Physician Assistant

## 2022-02-28 ENCOUNTER — Ambulatory Visit: Payer: Self-pay

## 2022-02-28 DIAGNOSIS — J208 Acute bronchitis due to other specified organisms: Secondary | ICD-10-CM

## 2022-02-28 DIAGNOSIS — R0602 Shortness of breath: Secondary | ICD-10-CM

## 2022-02-28 DIAGNOSIS — B9689 Other specified bacterial agents as the cause of diseases classified elsewhere: Secondary | ICD-10-CM

## 2022-02-28 DIAGNOSIS — U071 COVID-19: Secondary | ICD-10-CM

## 2022-02-28 MED ORDER — AZITHROMYCIN 250 MG PO TABS: ORAL_TABLET | ORAL | 0 refills | Status: AC

## 2022-02-28 MED ORDER — BENZONATATE 100 MG PO CAPS: 100.0000 mg | ORAL_CAPSULE | Freq: Three times a day (TID) | ORAL | 0 refills | Status: DC | PRN

## 2022-02-28 NOTE — Progress Notes (Signed)
We are sorry that you are not feeling well.  Here is how we plan to help!  Based on your presentation I believe you most likely have A cough due to bacteria.  When patients have a fever and a productive cough with a change in color or increased sputum production, we are concerned about bacterial bronchitis.  If left untreated it can progress to pneumonia.  If your symptoms do not improve with your treatment plan it is important that you contact your provider.   I have prescribed Azithromyin 250 mg: two tablets now and then one tablet daily for 4 additonal days    In addition you may use A non-prescription cough medication called Mucinex DM: take 2 tablets every 12 hours. and A prescription cough medication called Tessalon Perles 100mg. You may take 1-2 capsules every 8 hours as needed for your cough.   From your responses in the eVisit questionnaire you describe inflammation in the upper respiratory tract which is causing a significant cough.  This is commonly called Bronchitis and has four common causes:   Allergies Viral Infections Acid Reflux Bacterial Infection Allergies, viruses and acid reflux are treated by controlling symptoms or eliminating the cause. An example might be a cough caused by taking certain blood pressure medications. You stop the cough by changing the medication. Another example might be a cough caused by acid reflux. Controlling the reflux helps control the cough.  USE OF BRONCHODILATOR ("RESCUE") INHALERS: There is a risk from using your bronchodilator too frequently.  The risk is that over-reliance on a medication which only relaxes the muscles surrounding the breathing tubes can reduce the effectiveness of medications prescribed to reduce swelling and congestion of the tubes themselves.  Although you feel brief relief from the bronchodilator inhaler, your asthma may actually be worsening with the tubes becoming more swollen and filled with mucus.  This can delay other  crucial treatments, such as oral steroid medications. If you need to use a bronchodilator inhaler daily, several times per day, you should discuss this with your provider.  There are probably better treatments that could be used to keep your asthma under control.     HOME CARE Only take medications as instructed by your medical team. Complete the entire course of an antibiotic. Drink plenty of fluids and get plenty of rest. Avoid close contacts especially the very young and the elderly Cover your mouth if you cough or cough into your sleeve. Always remember to wash your hands A steam or ultrasonic humidifier can help congestion.   GET HELP RIGHT AWAY IF: You develop worsening fever. You become short of breath You cough up blood. Your symptoms persist after you have completed your treatment plan MAKE SURE YOU  Understand these instructions. Will watch your condition. Will get help right away if you are not doing well or get worse.    Thank you for choosing an e-visit.  Your e-visit answers were reviewed by a board certified advanced clinical practitioner to complete your personal care plan. Depending upon the condition, your plan could have included both over the counter or prescription medications.  Please review your pharmacy choice. Make sure the pharmacy is open so you can pick up prescription now. If there is a problem, you may contact your provider through MyChart messaging and have the prescription routed to another pharmacy.  Your safety is important to us. If you have drug allergies check your prescription carefully.   For the next 24 hours you can use   MyChart to ask questions about today's visit, request a non-urgent call back, or ask for a work or school excuse. You will get an email in the next two days asking about your experience. I hope that your e-visit has been valuable and will speed your recovery.  I provided 5 minutes of non face-to-face time during this encounter  for chart review and documentation.   

## 2022-02-28 NOTE — Progress Notes (Signed)
Patient completed EV earlier and did not log in for video, nor was the visit canceled. Will mark Erroneous for duplicate encounter

## 2022-03-01 NOTE — Progress Notes (Signed)
  Based on what you have shared with me, you need to seek an evaluation for more severe COVID and frequent shortness of breath. I recommend that you be seen and evaluated "face to face" either with PCP office, local urgent care or if severe shortness of breath, the ER. Our Emergency Departments are best equipped to handle patients with severe symptoms.    If you are having a true medical emergency please call 911.   Based on what you shared with me, I feel your condition warrants further evaluation as soon as possible at an Emergency department.    NOTE: There will be NO CHARGE for this eVisit   If you are having a true medical emergency please call 911.      Emergency [facility]  Get Driving Directions  [phone]  [address]  [place], [place] [address]  Open 24/7/365      [facility] Emergency Department at Day  [address]  [place], [place] [address]  Open 24/7/365    Emergency [facility]  Get Driving Directions  [phone]  [address], [place] [address]  Open 24/7/365      [facility]  Get Driving Directions  [phone]  [address]  [place], [place] [address]  Open 24/7/365    [facility]  Emergency [place]  Get Driving Directions  [phone]  [place], [place] [address]  Open 24/7/365    [place]  Get Driving Directions  [address]  [place], [place] [address]  Open 24/7/365    [facility]  Emergency [facility]  Get Driving Directions  [phone]  [address]  [place], [place] [address]  Open 24/7/365

## 2022-03-02 ENCOUNTER — Ambulatory Visit (INDEPENDENT_AMBULATORY_CARE_PROVIDER_SITE_OTHER): Payer: Self-pay

## 2022-03-02 ENCOUNTER — Ambulatory Visit
Admission: RE | Admit: 2022-03-02 | Discharge: 2022-03-02 | Disposition: A | Discharge status: Home / Self Care | Payer: Self-pay | Source: Ambulatory Visit | Attending: Urgent Care | Admitting: Urgent Care

## 2022-03-02 ENCOUNTER — Ambulatory Visit: Payer: Self-pay

## 2022-03-02 VITALS — BP 117/80 | HR 101 | Temp 98.6°F | Resp 18

## 2022-03-02 DIAGNOSIS — R051 Acute cough: Secondary | ICD-10-CM

## 2022-03-02 DIAGNOSIS — R5383 Other fatigue: Secondary | ICD-10-CM

## 2022-03-02 DIAGNOSIS — U071 COVID-19: Secondary | ICD-10-CM

## 2022-03-02 DIAGNOSIS — Z8709 Personal history of other diseases of the respiratory system: Secondary | ICD-10-CM

## 2022-03-02 DIAGNOSIS — R5381 Other malaise: Secondary | ICD-10-CM

## 2022-03-02 MED ORDER — CETIRIZINE HCL 10 MG PO TABS: 10.0000 mg | ORAL_TABLET | Freq: Every day | ORAL | 0 refills | Status: DC

## 2022-03-02 MED ORDER — PREDNISONE 20 MG PO TABS: ORAL_TABLET | ORAL | 0 refills | Status: DC

## 2022-03-02 MED ORDER — ALBUTEROL SULFATE HFA 108 (90 BASE) MCG/ACT IN AERS: 1.0000 | INHALATION_SPRAY | Freq: Four times a day (QID) | RESPIRATORY_TRACT | 0 refills | Status: DC | PRN

## 2022-03-02 MED ORDER — PROMETHAZINE-DM 6.25-15 MG/5ML PO SYRP: 5.0000 mL | ORAL_SOLUTION | Freq: Three times a day (TID) | ORAL | 0 refills | Status: DC | PRN

## 2022-03-02 NOTE — ED Triage Notes (Signed)
Patient presents to Urgent Care with complaints of dry cough and SOB since 06/15. Symptoms have worsened. Had an e-visit and prescribed antibiotics and cough meds no relief. Last known fever on Sunday. Hx of asthma. Tested positive for COVID 06/19.

## 2022-03-02 NOTE — ED Provider Notes (Signed)
[name]   MRN: [id number] DOB: 1996/01/16  Subjective:   Sarah Harvey is a 26 y.o. female presenting for 6-day history of acute onset persistent dry hacking cough, shortness of breath, malaise and fatigue.  Patient has done 3 COVID tests and was positive on 02/28/2022.  She did have an ED visit prior to that and was prescribed a course of azithromycin.  She was also prescribed cough medications.  She experienced no relief from this.  No current facility-administered medications for this encounter.  Current Outpatient Medications:    azithromycin (ZITHROMAX) 250 MG tablet, Take 2 tablets on day 1, then 1 tablet daily on days 2 through 5, Disp: 6 tablet, Rfl: 0   benzonatate (TESSALON) 100 MG capsule, Take 1 capsule (100 mg total) by mouth 3 (three) times daily as needed., Disp: 30 capsule, Rfl: 0   Allergies  Allergen Reactions   Sulfa Antibiotics Hives    Other reaction(s): Unknown    Past Medical History:  Diagnosis Date   Asthma    Depression    GERD (gastroesophageal reflux disease)    History of migraine    POTS (postural orthostatic tachycardia syndrome)    Seizures (HCC)    Syncope      Past Surgical History:  Procedure Laterality Date   WRIST SURGERY      Family History  Problem Relation Age of Onset   Arthritis Mother    Anxiety disorder Mother    42 / [place] Mother    Alcohol abuse Father    Depression Father    Drug abuse Father    Diabetes Father    Hyperlipidemia Father    Hypertension Father    Arthritis Maternal Grandmother    Depression Maternal Grandmother    Hyperlipidemia Maternal Grandmother    Hypertension Maternal Grandmother    Learning disabilities Maternal Grandmother    Cancer Maternal Grandfather    Heart disease Maternal Grandfather    Hyperlipidemia Maternal Grandfather    Cancer Paternal Grandfather     Social History   Tobacco Use   Smoking status: Never   Smokeless tobacco: Never   Vaping Use   Vaping Use: Never used  Substance Use Topics   Alcohol use: Yes    Comment: every now and then   Drug use: No    ROS   Objective:   Vitals: BP 117/80   Pulse (!) 101   Temp 98.6 F (37 C)   Resp 18   LMP 02/07/2022 (Approximate)   SpO2 98%   Physical Exam Constitutional:      General: She is not in acute distress.    Appearance: Normal appearance. She is well-developed. She is obese. She is not ill-appearing, toxic-appearing or diaphoretic.  HENT:     Head: Normocephalic and atraumatic.     Nose: Nose normal.     Mouth/Throat:     Mouth: Mucous membranes are moist.  Eyes:     General: No scleral icterus.       Right eye: No discharge.        Left eye: No discharge.     Extraocular Movements: Extraocular movements intact.  Cardiovascular:     Rate and Rhythm: Normal rate and regular rhythm.     Heart sounds: Normal heart sounds. No murmur heard.    No friction rub. No gallop.  Pulmonary:     Effort: Pulmonary effort is normal. No respiratory distress.     Breath sounds: No stridor. No wheezing, rhonchi or  rales.  Chest:     Chest wall: No tenderness.  Skin:    General: Skin is warm and dry.  Neurological:     General: No focal deficit present.     Mental Status: She is alert and oriented to person, place, and time.  Psychiatric:        Mood and Affect: Mood normal.        Behavior: Behavior normal.     DG Chest 2 View  Result Date: 03/02/2022 CLINICAL DATA:  Cough, COVID EXAM: CHEST - 2 VIEW COMPARISON:  None Available. FINDINGS: The heart size and mediastinal contours are within normal limits. Both lungs are clear. The visualized skeletal structures are unremarkable. IMPRESSION: No active cardiopulmonary disease. Electronically Signed   By: Valetta Mole M.D.   On: 03/02/2022 12:24     Assessment and Plan :   PDMP not reviewed this encounter.  1. Clinical diagnosis of COVID-19   2. Acute cough   3. Malaise and fatigue   4. History of  asthma    Patient was inappropriately prescribed azithromycin through a video visit to address acute bronchitis.  No imaging was done, no testing was done.  She tested positive for COVID and therefore given the timeline of her illness I advised that we would not do a confirmation test.  She is not a candidate for COVID antivirals for the same reason.  Given her persistent respiratory symptoms and in the context of her history of asthma I offered her an oral prednisone course.  Recommended supportive care otherwise.  Provided her with a refill of an albuterol inhaler. Counseled patient on potential for adverse effects with medications prescribed/recommended today, ER and return-to-clinic precautions discussed, patient verbalized understanding.    Jaynee Eagles, PA-C 03/02/22 1228

## 2022-03-18 ENCOUNTER — Ambulatory Visit: Payer: Self-pay

## 2022-03-18 ENCOUNTER — Ambulatory Visit
Admission: RE | Admit: 2022-03-18 | Discharge: 2022-03-18 | Disposition: A | Discharge status: Home / Self Care | Payer: Self-pay | Source: Ambulatory Visit | Attending: Physician Assistant | Admitting: Physician Assistant

## 2022-03-18 VITALS — BP 135/79 | HR 75 | Temp 98.3°F | Resp 14 | Ht 63.0 in | Wt 210.0 lb

## 2022-03-18 DIAGNOSIS — R6884 Jaw pain: Secondary | ICD-10-CM

## 2022-03-18 DIAGNOSIS — M26622 Arthralgia of left temporomandibular joint: Secondary | ICD-10-CM

## 2022-03-18 MED ORDER — TIZANIDINE HCL 4 MG PO TABS: 4.0000 mg | ORAL_TABLET | Freq: Every evening | ORAL | 0 refills | Status: AC | PRN

## 2022-03-18 MED ORDER — NAPROXEN 500 MG PO TABS: 500.0000 mg | ORAL_TABLET | Freq: Two times a day (BID) | ORAL | 0 refills | Status: DC | PRN

## 2022-03-18 NOTE — ED Provider Notes (Signed)
[facility]    CSN: [id number] Arrival date & time: 03/18/22  1601      History   Chief Complaint Chief Complaint  Patient presents with   Facial Pain    Left side    HPI Sarah Harvey is a 26 y.o. female presenting for left sided jaw pain with radiation to the left ear and sometimes to the left side of her neck.  She reports this has been occurring off and on for the past 2 weeks.  Pain is worse at night.  Patient reports that she does clench her jaw and she has a history of anxiety.  Reports she is no longer taking anything for anxiety.  She says she has tried ibuprofen here and there but it has not seemed to help.  She has not had any associated fever, nasal congestion, cough, sore throat.  Denies associated dizziness, weakness, nausea/vomiting.  Patient reports she has a history of a crossbite according to a dentist.  Reports she thinks she may have TMJ but has not been diagnosed with it.  HPI  Past Medical History:  Diagnosis Date   Asthma    Depression    GERD (gastroesophageal reflux disease)    History of migraine    POTS (postural orthostatic tachycardia syndrome)    Seizures ([place])    Syncope     Patient Active Problem List   Diagnosis Date Noted   Menstrual changes 01/25/2022   Thrombocytosis 05/29/2019   POTS (postural orthostatic tachycardia syndrome) 12/15/2018   History of migraine 12/15/2018   Asthma 12/15/2018   Mild depression 12/15/2018   GERD (gastroesophageal reflux disease) 12/15/2018   Fatigue 12/11/2018   Partial scapholunate tear 08/06/2013   Contusion of unspecified wrist, initial encounter 07/09/2013   Sprain of wrist 07/09/2013   Wrist pain 07/09/2013   Syncope and collapse 06/14/2013   Tachycardia 06/14/2013    Past Surgical History:  Procedure Laterality Date   WRIST SURGERY      OB History   No obstetric history on file.      Home Medications    Prior to Admission medications   Medication Sig Start Date  End Date Taking? Authorizing Provider  naproxen (NAPROSYN) 500 MG tablet Take 1 tablet (500 mg total) by mouth 2 (two) times daily as needed for moderate pain or headache. 03/18/22  Yes Laurene Footman B, PA-C  tiZANidine (ZANAFLEX) 4 MG tablet Take 1 tablet (4 mg total) by mouth at bedtime as needed for up to 15 days for muscle spasms (jaw pain). 03/18/22 04/02/22 Yes Danton Clap, PA-C  albuterol (VENTOLIN HFA) 108 (90 Base) MCG/ACT inhaler Inhale 1-2 puffs into the lungs every 6 (six) hours as needed for wheezing or shortness of breath. 03/02/22   Jaynee Eagles, PA-C    Family History Family History  Problem Relation Age of Onset   Arthritis Mother    Anxiety disorder Mother    Miscarriages / [place] Mother    Alcohol abuse Father    Depression Father    Drug abuse Father    Diabetes Father    Hyperlipidemia Father    Hypertension Father    Arthritis Maternal Grandmother    Depression Maternal Grandmother    Hyperlipidemia Maternal Grandmother    Hypertension Maternal Grandmother    Learning disabilities Maternal Grandmother    Cancer Maternal Grandfather    Heart disease Maternal Grandfather    Hyperlipidemia Maternal Grandfather    Cancer Paternal Grandfather     Social  History Social History   Tobacco Use   Smoking status: Never   Smokeless tobacco: Never  Vaping Use   Vaping Use: Never used  Substance Use Topics   Alcohol use: Yes    Comment: every now and then   Drug use: No     Allergies   Sulfa antibiotics   Review of Systems Review of Systems  Constitutional:  Negative for fatigue and fever.  HENT:  Positive for ear pain. Negative for congestion, ear discharge, facial swelling, rhinorrhea and sore throat.   Eyes:  Negative for visual disturbance.  Respiratory:  Negative for cough.   Gastrointestinal:  Negative for nausea and vomiting.  Musculoskeletal:  Positive for neck pain. Negative for neck stiffness.  Neurological:  Positive for headaches. Negative  for dizziness, syncope, weakness and numbness.     Physical Exam Triage Vital Signs ED Triage Vitals  Enc Vitals Group     BP 03/18/22 0855 135/79     Pulse Rate 03/18/22 0855 75     Resp 03/18/22 0855 14     Temp 03/18/22 0855 98.3 F (36.8 C)     Temp Source 03/18/22 0855 Oral     SpO2 03/18/22 0855 100 %     Weight 03/18/22 0851 210 lb (95.3 kg)     Height 03/18/22 0851 '5\' 3"'$  (1.6 m)     Head Circumference --      Peak Flow --      Pain Score 03/18/22 0851 6     Pain Loc --      Pain Edu? --      Excl. in [place]? --    No data found.  Updated Vital Signs BP 135/79 (BP Location: Right Arm)   Pulse 75   Temp 98.3 F (36.8 C) (Oral)   Resp 14   Ht '5\' 3"'$  (1.6 m)   Wt 210 lb (95.3 kg)   LMP 02/07/2022 (Approximate)   SpO2 100%   BMI 37.20 kg/m      Physical Exam Vitals and nursing note reviewed.  Constitutional:      General: She is not in acute distress.    Appearance: Normal appearance. She is not ill-appearing or toxic-appearing.  HENT:     Head: Normocephalic and atraumatic.     Jaw: Tenderness (Left TMJ) and pain on movement present. No swelling.     Comments: No clicking, popping or locking of jaw    Right Ear: Tympanic membrane, ear canal and external ear normal.     Left Ear: Tympanic membrane, ear canal and external ear normal.     Nose: Nose normal.     Mouth/Throat:     Mouth: Mucous membranes are moist.     Dentition: No dental tenderness.     Pharynx: Oropharynx is clear.  Eyes:     General: No scleral icterus.       Right eye: No discharge.        Left eye: No discharge.     Conjunctiva/sclera: Conjunctivae normal.  Cardiovascular:     Rate and Rhythm: Normal rate and regular rhythm.     Heart sounds: Normal heart sounds.  Pulmonary:     Effort: Pulmonary effort is normal. No respiratory distress.     Breath sounds: Normal breath sounds.  Musculoskeletal:     Cervical back: Normal range of motion and neck supple. No tenderness.  Skin:     General: Skin is dry.  Neurological:     General: No focal deficit  present.     Mental Status: She is alert. Mental status is at baseline.     Motor: No weakness.     Gait: Gait normal.  Psychiatric:        Mood and Affect: Mood normal.        Behavior: Behavior normal.        Thought Content: Thought content normal.      UC Treatments / Results  Labs (all labs ordered are listed, but only abnormal results are displayed) Labs Reviewed - No data to display  EKG   Radiology No results found.  Procedures Procedures (including critical care time)  Medications Ordered in UC Medications - No data to display  Initial Impression / Assessment and Plan / UC Course  I have reviewed the triage vital signs and the nursing notes.  Pertinent labs & imaging results that were available during my care of the patient were reviewed by me and considered in my medical decision making (see chart for details).  26 year old female presenting for left-sided jaw, facial and neck pain for the past couple of weeks.  History of crossbite according to patient.  History of grinding her teeth at night and anxiety.  On exam she does have tenderness palpation of the left TMJ and pain with full opening of her mouth.  No clicking, popping or locking.  Advised patient symptoms are consistent with TMJ arthralgia.  Sent naproxen to pharmacy for inflammation and tizanidine to use at nighttime.  Also, advised RICE guidelines, soft foods and avoid chewing gum.  Try to reduce stress.  Advised following up with dentist as she may need a mouthguard.  ER if locked jaw.   Final Clinical Impressions(s) / UC Diagnoses   Final diagnoses:  Arthralgia of left temporomandibular joint  Jaw pain     Discharge Instructions      -Your pain is consistent with TMJ arthralgia.  I have sent anti-inflammatory medicine for you and a muscle relaxer to try at nighttime. - As we discussed if you are not improving over the next 7 to  10 days you should consider following up with your dentist so they may assess your bite and see if you need a mouthguard. - Stick to chewing soft foods and avoid chewing gum.  Try to reduce your stress.     ED Prescriptions     Medication Sig Dispense Auth. Provider   naproxen (NAPROSYN) 500 MG tablet Take 1 tablet (500 mg total) by mouth 2 (two) times daily as needed for moderate pain or headache. 30 tablet Laurene Footman B, PA-C   tiZANidine (ZANAFLEX) 4 MG tablet Take 1 tablet (4 mg total) by mouth at bedtime as needed for up to 15 days for muscle spasms (jaw pain). 15 tablet Gretta Cool      PDMP not reviewed this encounter.   Danton Clap, PA-C 03/18/22 [phone]

## 2022-03-18 NOTE — ED Triage Notes (Signed)
Patient c/o left sided facial and jaw pain that radiates to her left ear that started over 2 weeks ago.  Patient that she does grind her teeth at night.  Patient denies fevers.

## 2022-03-18 NOTE — Discharge Instructions (Addendum)
-  Your pain is consistent with TMJ arthralgia.  I have sent anti-inflammatory medicine for you and a muscle relaxer to try at nighttime. - As we discussed if you are not improving over the next 7 to 10 days you should consider following up with your dentist so they may assess your bite and see if you need a mouthguard. - Stick to chewing soft foods and avoid chewing gum.  Try to reduce your stress.

## 2022-04-11 ENCOUNTER — Telehealth: Payer: Self-pay | Admitting: Physician Assistant

## 2022-04-11 ENCOUNTER — Ambulatory Visit: Payer: Self-pay

## 2022-04-11 DIAGNOSIS — B9689 Other specified bacterial agents as the cause of diseases classified elsewhere: Secondary | ICD-10-CM

## 2022-04-11 DIAGNOSIS — J019 Acute sinusitis, unspecified: Secondary | ICD-10-CM

## 2022-04-11 MED ORDER — AMOXICILLIN-POT CLAVULANATE 875-125 MG PO TABS: 1.0000 | ORAL_TABLET | Freq: Two times a day (BID) | ORAL | 0 refills | Status: DC

## 2022-04-11 NOTE — Progress Notes (Signed)

## 2022-04-27 ENCOUNTER — Encounter: Payer: Commercial Managed Care - PPO | Admitting: Internal Medicine

## 2022-05-19 ENCOUNTER — Ambulatory Visit (INDEPENDENT_AMBULATORY_CARE_PROVIDER_SITE_OTHER): Payer: Self-pay | Admitting: Internal Medicine

## 2022-05-19 ENCOUNTER — Encounter: Payer: Self-pay | Admitting: Internal Medicine

## 2022-05-19 VITALS — BP 124/82 | HR 101 | Temp 99.1°F | Ht 63.0 in | Wt 268.6 lb

## 2022-05-19 DIAGNOSIS — D75839 Thrombocytosis, unspecified: Secondary | ICD-10-CM

## 2022-05-19 DIAGNOSIS — F320 Major depressive disorder, single episode, mild: Secondary | ICD-10-CM

## 2022-05-19 DIAGNOSIS — D473 Essential (hemorrhagic) thrombocythemia: Secondary | ICD-10-CM

## 2022-05-19 NOTE — Progress Notes (Signed)
Patient ID: Sarah Harvey, female   DOB: 29-Jul-1996, 26 y.o.   MRN: [id number]   Subjective:    Patient ID: Sarah Harvey, female    DOB: March 17, 1996, 26 y.o.   MRN: [id number]   Patient here for work in appt.   Chief Complaint  Patient presents with   mental health issues   .   HPI Work in to discuss increased stress.  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  Increased stress.  Increased crying.  Feels down.  Looking for a new job.  Would like to work with pre school children.  Tries to stay active.  No vomiting or abdominal pain reported.  Trying to get pregnant.  Feels needs something to help level things out.     Past Medical History:  Diagnosis Date   Asthma    Depression    GERD (gastroesophageal reflux disease)    History of migraine    POTS (postural orthostatic tachycardia syndrome)    Seizures (HCC)    Syncope    Past Surgical History:  Procedure Laterality Date   WRIST SURGERY     Family History  Problem Relation Age of Onset   Arthritis Mother    Anxiety disorder Mother    50 / [place] Mother    Alcohol abuse Father    Depression Father    Drug abuse Father    Diabetes Father    Hyperlipidemia Father    Hypertension Father    Arthritis Maternal Grandmother    Depression Maternal Grandmother    Hyperlipidemia Maternal Grandmother    Hypertension Maternal Grandmother    Learning disabilities Maternal Grandmother    Cancer Maternal Grandfather    Heart disease Maternal Grandfather    Hyperlipidemia Maternal Grandfather    Cancer Paternal Grandfather    Social History   Socioeconomic History   Marital status: Married    Spouse name: Not on file   Number of children: Not on file   Years of education: Not on file   Highest education level: Not on file  Occupational History   Not on file  Tobacco Use   Smoking status: Never   Smokeless tobacco: Never  Vaping Use   Vaping Use: Never used  Substance and Sexual  Activity   Alcohol use: Yes    Comment: every now and then   Drug use: No   Sexual activity: Yes    Birth control/protection: Condom, Pill  Other Topics Concern   Not on file  Social History Narrative   ** Merged History Encounter **       ** Merged History Encounter **       Social Determinants of Health   Financial Resource Strain: Not on file  Food Insecurity: Not on file  Transportation Needs: Not on file  Physical Activity: Not on file  Stress: Not on file  Social Connections: Not on file     Review of Systems  Constitutional:  Negative for appetite change and unexpected weight change.  HENT:  Negative for congestion and sinus pressure.   Respiratory:  Negative for cough, chest tightness and shortness of breath.   Cardiovascular:  Negative for chest pain, palpitations and leg swelling.  Gastrointestinal:  Negative for abdominal pain, diarrhea, nausea and vomiting.  Genitourinary:  Negative for difficulty urinating and dysuria.  Musculoskeletal:  Negative for joint swelling and myalgias.  Skin:  Negative for color change and rash.  Neurological:  Negative for dizziness, light-headedness and headaches.  Psychiatric/Behavioral:  Increased stress/depression as outlined.  No SI.        Objective:     BP 124/82 (BP Location: Left Arm, Patient Position: Sitting, Cuff Size: Normal)   Pulse (!) 101   Temp 99.1 F (37.3 C) (Oral)   Ht '5\' 3"'$  (1.6 m)   Wt 268 lb 9.6 oz (121.8 kg)   LMP  (LMP Unknown)   SpO2 97%   BMI 47.58 kg/m  Wt Readings from Last 3 Encounters:  05/19/22 268 lb 9.6 oz (121.8 kg)  03/18/22 210 lb (95.3 kg)  01/25/22 240 lb 9.6 oz (109.1 kg)    Physical Exam Vitals reviewed.  Constitutional:      General: She is not in acute distress.    Appearance: Normal appearance.  HENT:     Head: Normocephalic and atraumatic.     Right Ear: External ear normal.     Left Ear: External ear normal.  Eyes:     General: No scleral icterus.        Right eye: No discharge.        Left eye: No discharge.     Conjunctiva/sclera: Conjunctivae normal.  Neck:     Thyroid: No thyromegaly.  Cardiovascular:     Rate and Rhythm: Normal rate and regular rhythm.  Pulmonary:     Effort: No respiratory distress.     Breath sounds: Normal breath sounds. No wheezing.  Abdominal:     General: Bowel sounds are normal.     Palpations: Abdomen is soft.     Tenderness: There is no abdominal tenderness.  Musculoskeletal:        General: No swelling or tenderness.     Cervical back: Neck supple. No tenderness.  Lymphadenopathy:     Cervical: No cervical adenopathy.  Skin:    Findings: No erythema or rash.  Neurological:     Mental Status: She is alert.  Psychiatric:        Mood and Affect: Mood normal.        Behavior: Behavior normal.      Outpatient Encounter Medications as of 05/19/2022  Medication Sig   albuterol (VENTOLIN HFA) 108 (90 Base) MCG/ACT inhaler Inhale 1-2 puffs into the lungs every 6 (six) hours as needed for wheezing or shortness of breath.   amoxicillin-clavulanate (AUGMENTIN) 875-125 MG tablet Take 1 tablet by mouth 2 (two) times daily.   naproxen (NAPROSYN) 500 MG tablet Take 1 tablet (500 mg total) by mouth 2 (two) times daily as needed for moderate pain or headache.   sertraline (ZOLOFT) 50 MG tablet Take 1/2 tablet x 1 week and then increase to 1 tablet per day.   No facility-administered encounter medications on file as of 05/19/2022.     Lab Results  Component Value Date   WBC 7.3 01/25/2022   HGB 13.5 01/25/2022   HCT 40.2 01/25/2022   PLT 422.0 (H) 01/25/2022   GLUCOSE 116 (H) 01/25/2022   CHOL 120 05/29/2019   TRIG 133.0 05/29/2019   HDL 43.90 05/29/2019   LDLCALC 50 05/29/2019   ALT 18 01/25/2022   AST 15 01/25/2022   NA 139 01/25/2022   K 4.3 01/25/2022   CL 103 01/25/2022   CREATININE 0.84 01/25/2022   BUN 15 01/25/2022   CO2 28 01/25/2022   TSH 1.22 01/25/2022    No results found.      Assessment & Plan:   Problem List Items Addressed This Visit     Major depressive disorder, single episode, mild (  Sarah Harvey) - Primary    Discussed today.  Feels needs something to help level things out.  Discussed medication.  Discuss with psychiatry.  Planning pregnancy.  Addendum:  Discussed with psychiatry.  Discussed with Sarah Harvey.  Start zoloft as directed.  Follow.  Referral to psych.       Relevant Medications   sertraline (ZOLOFT) 50 MG tablet   Other Relevant Orders   Ambulatory referral to Psychiatry   Thrombocytosis    Follow cbc.  Platelet count slightly elevated.       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

## 2022-05-23 ENCOUNTER — Ambulatory Visit: Payer: Self-pay | Admitting: Internal Medicine

## 2022-05-25 MED ORDER — SERTRALINE HCL 50 MG PO TABS: ORAL_TABLET | ORAL | 2 refills | Status: DC

## 2022-05-29 ENCOUNTER — Encounter: Payer: Self-pay | Admitting: Internal Medicine

## 2022-05-29 DIAGNOSIS — F320 Major depressive disorder, single episode, mild: Secondary | ICD-10-CM | POA: Insufficient documentation

## 2022-05-29 NOTE — Assessment & Plan Note (Signed)
Discussed today.  Feels needs something to help level things out.  Discussed medication.  Discuss with psychiatry.  Planning pregnancy.  Addendum:  Discussed with psychiatry.  Discussed with [place].  Start zoloft as directed.  Follow.  Referral to psych.

## 2022-05-29 NOTE — Assessment & Plan Note (Signed)
Follow cbc.  Platelet count slightly elevated.

## 2022-05-30 ENCOUNTER — Telehealth: Payer: Self-pay | Admitting: Internal Medicine

## 2022-05-30 ENCOUNTER — Ambulatory Visit
Admission: RE | Admit: 2022-05-30 | Discharge: 2022-05-30 | Disposition: A | Discharge status: Home / Self Care | Payer: Self-pay | Source: Ambulatory Visit | Attending: Emergency Medicine | Admitting: Emergency Medicine

## 2022-05-30 DIAGNOSIS — F439 Reaction to severe stress, unspecified: Secondary | ICD-10-CM

## 2022-05-30 DIAGNOSIS — Z111 Encounter for screening for respiratory tuberculosis: Secondary | ICD-10-CM

## 2022-05-30 DIAGNOSIS — F32A Depression, unspecified: Secondary | ICD-10-CM

## 2022-05-30 MED ORDER — TUBERCULIN PPD 5 UNIT/0.1ML ID SOLN
5.0000 [IU] | Freq: Once | INTRADERMAL | Status: DC
  Administered 2022-05-30: 5 [IU] via INTRADERMAL

## 2022-05-30 NOTE — Telephone Encounter (Signed)
Reviewed form.  Needs TB skin test, etc.  See if she can come in for appt 4:00 on 05/31/22.

## 2022-05-30 NOTE — Telephone Encounter (Signed)
Form placed in your quick sign folder

## 2022-05-30 NOTE — Telephone Encounter (Signed)
Pt came into office to drop off medical form. Placed in provider folder up front

## 2022-05-30 NOTE — Telephone Encounter (Signed)
Sarah Harvey from [facility] office called stating they are not in network with pt insurance

## 2022-05-31 ENCOUNTER — Ambulatory Visit (INDEPENDENT_AMBULATORY_CARE_PROVIDER_SITE_OTHER): Payer: Self-pay | Admitting: Internal Medicine

## 2022-05-31 ENCOUNTER — Encounter: Payer: Self-pay | Admitting: Internal Medicine

## 2022-05-31 DIAGNOSIS — Z0289 Encounter for other administrative examinations: Secondary | ICD-10-CM

## 2022-05-31 DIAGNOSIS — F320 Major depressive disorder, single episode, mild: Secondary | ICD-10-CM

## 2022-05-31 NOTE — Telephone Encounter (Signed)
Called pt to ask if able to come in for appt today 05/31/22 '@4pm'$  due to medical form. See note, unable to reach [facility], due to vm being full.

## 2022-05-31 NOTE — Progress Notes (Signed)
Patient ID: Sarah Harvey, female   DOB: Feb 02, 1996, 26 y.o.   MRN: [id number]   Subjective:    Patient ID: Sarah Harvey, female    DOB: 18-Aug-1996, 26 y.o.   MRN: [id number]    Patient here for work in appt .   HPI Work in to discuss work form and form completion.  She is doing better.  Was able to get a new job.  Increased stress with her previous job.  She is now planning on working at a preschool.  Needs form completed.  Had TB skin test placed yesterday.  Will send me information once read.  States she is physically doing relatively well.  No chest pain or sob reported.  No abdominal pain.  Bowels moving.  Able to lift without difficulty.     Past Medical History:  Diagnosis Date   Asthma    Depression    GERD (gastroesophageal reflux disease)    History of migraine    POTS (postural orthostatic tachycardia syndrome)    Seizures (HCC)    Syncope    Past Surgical History:  Procedure Laterality Date   WRIST SURGERY     Family History  Problem Relation Age of Onset   Arthritis Mother    Anxiety disorder Mother    69 / [place] Mother    Alcohol abuse Father    Depression Father    Drug abuse Father    Diabetes Father    Hyperlipidemia Father    Hypertension Father    Arthritis Maternal Grandmother    Depression Maternal Grandmother    Hyperlipidemia Maternal Grandmother    Hypertension Maternal Grandmother    Learning disabilities Maternal Grandmother    Cancer Maternal Grandfather    Heart disease Maternal Grandfather    Hyperlipidemia Maternal Grandfather    Cancer Paternal Grandfather    Social History   Socioeconomic History   Marital status: Married    Spouse name: Not on file   Number of children: Not on file   Years of education: Not on file   Highest education level: Not on file  Occupational History   Not on file  Tobacco Use   Smoking status: Never   Smokeless tobacco: Never  Vaping Use   Vaping Use: Never used   Substance and Sexual Activity   Alcohol use: Yes    Comment: every now and then   Drug use: No   Sexual activity: Yes    Birth control/protection: Condom, Pill  Other Topics Concern   Not on file  Social History Narrative   ** Merged History Encounter **       ** Merged History Encounter **       Social Determinants of Health   Financial Resource Strain: Not on file  Food Insecurity: Not on file  Transportation Needs: Not on file  Physical Activity: Not on file  Stress: Not on file  Social Connections: Not on file     Review of Systems  Constitutional:  Negative for appetite change and unexpected weight change.  HENT:  Negative for congestion and sinus pressure.   Respiratory:  Negative for cough, chest tightness and shortness of breath.   Cardiovascular:  Negative for chest pain, palpitations and leg swelling.  Gastrointestinal:  Negative for abdominal pain, diarrhea, nausea and vomiting.  Genitourinary:  Negative for difficulty urinating and dysuria.  Musculoskeletal:  Negative for joint swelling and myalgias.  Skin:  Negative for color change and rash.  Neurological:  Negative for  dizziness, light-headedness and headaches.  Psychiatric/Behavioral:  Negative for agitation and dysphoric mood.        Increased stress.        Objective:     BP 130/84 (BP Location: Left Arm, Patient Position: Sitting, Cuff Size: Normal)   Pulse 81   Temp 98.7 F (37.1 C) (Oral)   Ht '5\' 3"'$  (1.6 m)   Wt 268 lb (121.6 kg)   LMP  (LMP Unknown)   SpO2 96%   BMI 47.47 kg/m  Wt Readings from Last 3 Encounters:  05/31/22 268 lb (121.6 kg)  05/19/22 268 lb 9.6 oz (121.8 kg)  03/18/22 210 lb (95.3 kg)    Physical Exam Vitals reviewed.  Constitutional:      General: She is not in acute distress.    Appearance: Normal appearance.  HENT:     Head: Normocephalic and atraumatic.     Right Ear: External ear normal.     Left Ear: External ear normal.  Eyes:     General: No scleral  icterus.       Right eye: No discharge.        Left eye: No discharge.     Conjunctiva/sclera: Conjunctivae normal.  Neck:     Thyroid: No thyromegaly.  Cardiovascular:     Rate and Rhythm: Normal rate and regular rhythm.  Pulmonary:     Effort: No respiratory distress.     Breath sounds: Normal breath sounds. No wheezing.  Abdominal:     General: Bowel sounds are normal.     Palpations: Abdomen is soft.     Tenderness: There is no abdominal tenderness.  Musculoskeletal:        General: No swelling or tenderness.     Cervical back: Neck supple. No tenderness.  Lymphadenopathy:     Cervical: No cervical adenopathy.  Skin:    Findings: No erythema or rash.  Neurological:     Mental Status: She is alert.  Psychiatric:        Mood and Affect: Mood normal.        Behavior: Behavior normal.      Outpatient Encounter Medications as of 05/31/2022  Medication Sig   sertraline (ZOLOFT) 50 MG tablet Take 1/2 tablet x 1 week and then increase to 1 tablet per day.   [DISCONTINUED] albuterol (VENTOLIN HFA) 108 (90 Base) MCG/ACT inhaler Inhale 1-2 puffs into the lungs every 6 (six) hours as needed for wheezing or shortness of breath. (Patient not taking: Reported on 05/31/2022)   [DISCONTINUED] amoxicillin-clavulanate (AUGMENTIN) 875-125 MG tablet Take 1 tablet by mouth 2 (two) times daily. (Patient not taking: Reported on 05/31/2022)   [DISCONTINUED] naproxen (NAPROSYN) 500 MG tablet Take 1 tablet (500 mg total) by mouth 2 (two) times daily as needed for moderate pain or headache. (Patient not taking: Reported on 05/31/2022)   No facility-administered encounter medications on file as of 05/31/2022.     Lab Results  Component Value Date   WBC 7.3 01/25/2022   HGB 13.5 01/25/2022   HCT 40.2 01/25/2022   PLT 422.0 (H) 01/25/2022   GLUCOSE 116 (H) 01/25/2022   CHOL 120 05/29/2019   TRIG 133.0 05/29/2019   HDL 43.90 05/29/2019   LDLCALC 50 05/29/2019   ALT 18 01/25/2022   AST 15  01/25/2022   NA 139 01/25/2022   K 4.3 01/25/2022   CL 103 01/25/2022   CREATININE 0.84 01/25/2022   BUN 15 01/25/2022   CO2 28 01/25/2022   TSH 1.22 01/25/2022  No results found.     Assessment & Plan:   Problem List Items Addressed This Visit     Encounter for completion of form with patient    Discussed work form.  Doing well.  TB skin test placed and is going to be read in a couple of days.  Will send information.  No limitations to starting her new job.        Major depressive disorder, single episode, mild ([place])    Discussed today.  She is doing better.  Started zoloft.  Continue.  Follow.         Einar Pheasant, MD

## 2022-05-31 NOTE — Telephone Encounter (Signed)
Patient called back and has been scheduled to see Dr. Einar Pheasant on 05/31/2022 at 4pm.

## 2022-06-01 ENCOUNTER — Ambulatory Visit
Admission: EM | Admit: 2022-06-01 | Discharge: 2022-06-01 | Disposition: A | Discharge status: Home / Self Care | Payer: Self-pay | Attending: Family Medicine | Admitting: Family Medicine

## 2022-06-01 ENCOUNTER — Ambulatory Visit: Payer: Self-pay | Admitting: Internal Medicine

## 2022-06-01 DIAGNOSIS — Z111 Encounter for screening for respiratory tuberculosis: Secondary | ICD-10-CM

## 2022-06-01 DIAGNOSIS — R7611 Nonspecific reaction to tuberculin skin test without active tuberculosis: Secondary | ICD-10-CM

## 2022-06-01 NOTE — Telephone Encounter (Signed)
Pt aware.  Psychiatry referral placed.  Request to see Dr Shea Evans.  Thanks.

## 2022-06-01 NOTE — ED Notes (Signed)
PPD Reading Negative

## 2022-06-02 ENCOUNTER — Encounter: Payer: Self-pay | Admitting: Internal Medicine

## 2022-06-02 NOTE — Telephone Encounter (Signed)
Form completed.  Please fax.

## 2022-06-05 ENCOUNTER — Encounter: Payer: Self-pay | Admitting: Internal Medicine

## 2022-06-05 DIAGNOSIS — Z0289 Encounter for other administrative examinations: Secondary | ICD-10-CM | POA: Insufficient documentation

## 2022-06-05 NOTE — Assessment & Plan Note (Signed)
Discussed work form.  Doing well.  TB skin test placed and is going to be read in a couple of days.  Will send information.  No limitations to starting her new job.

## 2022-06-05 NOTE — Assessment & Plan Note (Signed)
Discussed today.  She is doing better.  Started zoloft.  Continue.  Follow.

## 2022-06-20 ENCOUNTER — Other Ambulatory Visit: Payer: Self-pay | Admitting: Internal Medicine

## 2022-09-12 HISTORY — PX: WISDOM TOOTH EXTRACTION: SHX21

## 2022-10-22 ENCOUNTER — Other Ambulatory Visit: Payer: Self-pay | Admitting: Internal Medicine

## 2022-10-27 ENCOUNTER — Ambulatory Visit: Payer: Self-pay | Admitting: Internal Medicine

## 2022-10-27 NOTE — Progress Notes (Deleted)
Subjective:    Patient ID: Sarah Harvey, female    DOB: 10-20-1995, 27 y.o.   MRN: [id number]  Patient here for No chief complaint on file.   HPI Here as a work in - request psychiatry referral.  Increased stress.  Seeing Dr Glennon Mac for w/up - anovulatory.    Past Medical History:  Diagnosis Date   Asthma    Depression    GERD (gastroesophageal reflux disease)    History of migraine    POTS (postural orthostatic tachycardia syndrome)    Seizures (HCC)    Syncope    Past Surgical History:  Procedure Laterality Date   WRIST SURGERY     Family History  Problem Relation Age of Onset   Arthritis Mother    Anxiety disorder Mother    29 / [place] Mother    Alcohol abuse Father    Depression Father    Drug abuse Father    Diabetes Father    Hyperlipidemia Father    Hypertension Father    Arthritis Maternal Grandmother    Depression Maternal Grandmother    Hyperlipidemia Maternal Grandmother    Hypertension Maternal Grandmother    Learning disabilities Maternal Grandmother    Cancer Maternal Grandfather    Heart disease Maternal Grandfather    Hyperlipidemia Maternal Grandfather    Cancer Paternal Grandfather    Social History   Socioeconomic History   Marital status: Married    Spouse name: Not on file   Number of children: Not on file   Years of education: Not on file   Highest education level: Not on file  Occupational History   Not on file  Tobacco Use   Smoking status: Never   Smokeless tobacco: Never  Vaping Use   Vaping Use: Never used  Substance and Sexual Activity   Alcohol use: Yes    Comment: every now and then   Drug use: No   Sexual activity: Yes    Birth control/protection: Condom, Pill  Other Topics Concern   Not on file  Social History Narrative   ** Merged History Encounter **       ** Merged History Encounter **       Social Determinants of Health   Financial Resource Strain: Not on file  Food Insecurity:  Not on file  Transportation Needs: Not on file  Physical Activity: Not on file  Stress: Not on file  Social Connections: Not on file     Review of Systems     Objective:     There were no vitals taken for this visit. Wt Readings from Last 3 Encounters:  05/31/22 268 lb (121.6 kg)  05/19/22 268 lb 9.6 oz (121.8 kg)  03/18/22 210 lb (95.3 kg)    Physical Exam   Outpatient Encounter Medications as of 10/27/2022  Medication Sig   sertraline (ZOLOFT) 50 MG tablet TAKE 1/2 TABLET X 1 WEEK AND THEN INCREASE TO 1 TABLET PER DAY.   No facility-administered encounter medications on file as of 10/27/2022.     Lab Results  Component Value Date   WBC 7.3 01/25/2022   HGB 13.5 01/25/2022   HCT 40.2 01/25/2022   PLT 422.0 (H) 01/25/2022   GLUCOSE 116 (H) 01/25/2022   CHOL 120 05/29/2019   TRIG 133.0 05/29/2019   HDL 43.90 05/29/2019   LDLCALC 50 05/29/2019   ALT 18 01/25/2022   AST 15 01/25/2022   NA 139 01/25/2022   K 4.3 01/25/2022   CL 103  01/25/2022   CREATININE 0.84 01/25/2022   BUN 15 01/25/2022   CO2 28 01/25/2022   TSH 1.22 01/25/2022    No results found.     Assessment & Plan:  There are no diagnoses linked to this encounter.   Einar Pheasant, MD

## 2022-12-16 ENCOUNTER — Telehealth: Payer: Self-pay | Admitting: Nurse Practitioner

## 2022-12-16 DIAGNOSIS — J0141 Acute recurrent pansinusitis: Secondary | ICD-10-CM

## 2022-12-16 MED ORDER — DOXYCYCLINE HYCLATE 100 MG PO TABS: 100.0000 mg | ORAL_TABLET | Freq: Two times a day (BID) | ORAL | 0 refills | Status: AC

## 2022-12-16 NOTE — Progress Notes (Signed)
E-Visit for Sinus Problems  We are sorry that you are not feeling well.  Here is how we plan to help!  Based on what you have shared with me it looks like you have sinusitis.  Sinusitis is inflammation and infection in the sinus cavities of the head.  Based on your presentation I believe you most likely have Acute Bacterial Sinusitis.  This is an infection caused by bacteria and is treated with antibiotics. I have prescribed Doxycycline 100mg by mouth twice a day for 10 days. You may use an oral decongestant such as Mucinex D or if you have glaucoma or high blood pressure use plain Mucinex. Saline nasal spray help and can safely be used as often as needed for congestion.  If you develop worsening sinus pain, fever or notice severe headache and vision changes, or if symptoms are not better after completion of antibiotic, please schedule an appointment with a health care provider.    Sinus infections are not as easily transmitted as other respiratory infection, however we still recommend that you avoid close contact with loved ones, especially the very young and elderly.  Remember to wash your hands thoroughly throughout the day as this is the number one way to prevent the spread of infection!  Home Care: Only take medications as instructed by your medical team. Complete the entire course of an antibiotic. Do not take these medications with alcohol. A steam or ultrasonic humidifier can help congestion.  You can place a towel over your head and breathe in the steam from hot water coming from a faucet. Avoid close contacts especially the very young and the elderly. Cover your mouth when you cough or sneeze. Always remember to wash your hands.  Get Help Right Away If: You develop worsening fever or sinus pain. You develop a severe head ache or visual changes. Your symptoms persist after you have completed your treatment plan.  Make sure you Understand these instructions. Will watch your  condition. Will get help right away if you are not doing well or get worse.  Thank you for choosing an e-visit.  Your e-visit answers were reviewed by a board certified advanced clinical practitioner to complete your personal care plan. Depending upon the condition, your plan could have included both over the counter or prescription medications.  Please review your pharmacy choice. Make sure the pharmacy is open so you can pick up prescription now. If there is a problem, you may contact your provider through MyChart messaging and have the prescription routed to another pharmacy.  Your safety is important to us. If you have drug allergies check your prescription carefully.   For the next 24 hours you can use MyChart to ask questions about today's visit, request a non-urgent call back, or ask for a work or school excuse. You will get an email in the next two days asking about your experience. I hope that your e-visit has been valuable and will speed your recovery.   Meds ordered this encounter  Medications   doxycycline (VIBRA-TABS) 100 MG tablet    Sig: Take 1 tablet (100 mg total) by mouth 2 (two) times daily for 10 days.    Dispense:  20 tablet    Refill:  0    I spent approximately 5 minutes reviewing the patient's history, current symptoms and coordinating their care today.   

## 2023-01-14 IMAGING — DX DG CHEST 2V
2 series · 2 of 2 positions shown · non-contrast
Comparison: None Available.

CLINICAL DATA: Cough, COVID

EXAM:
CHEST - 2 VIEW

[chest pa]
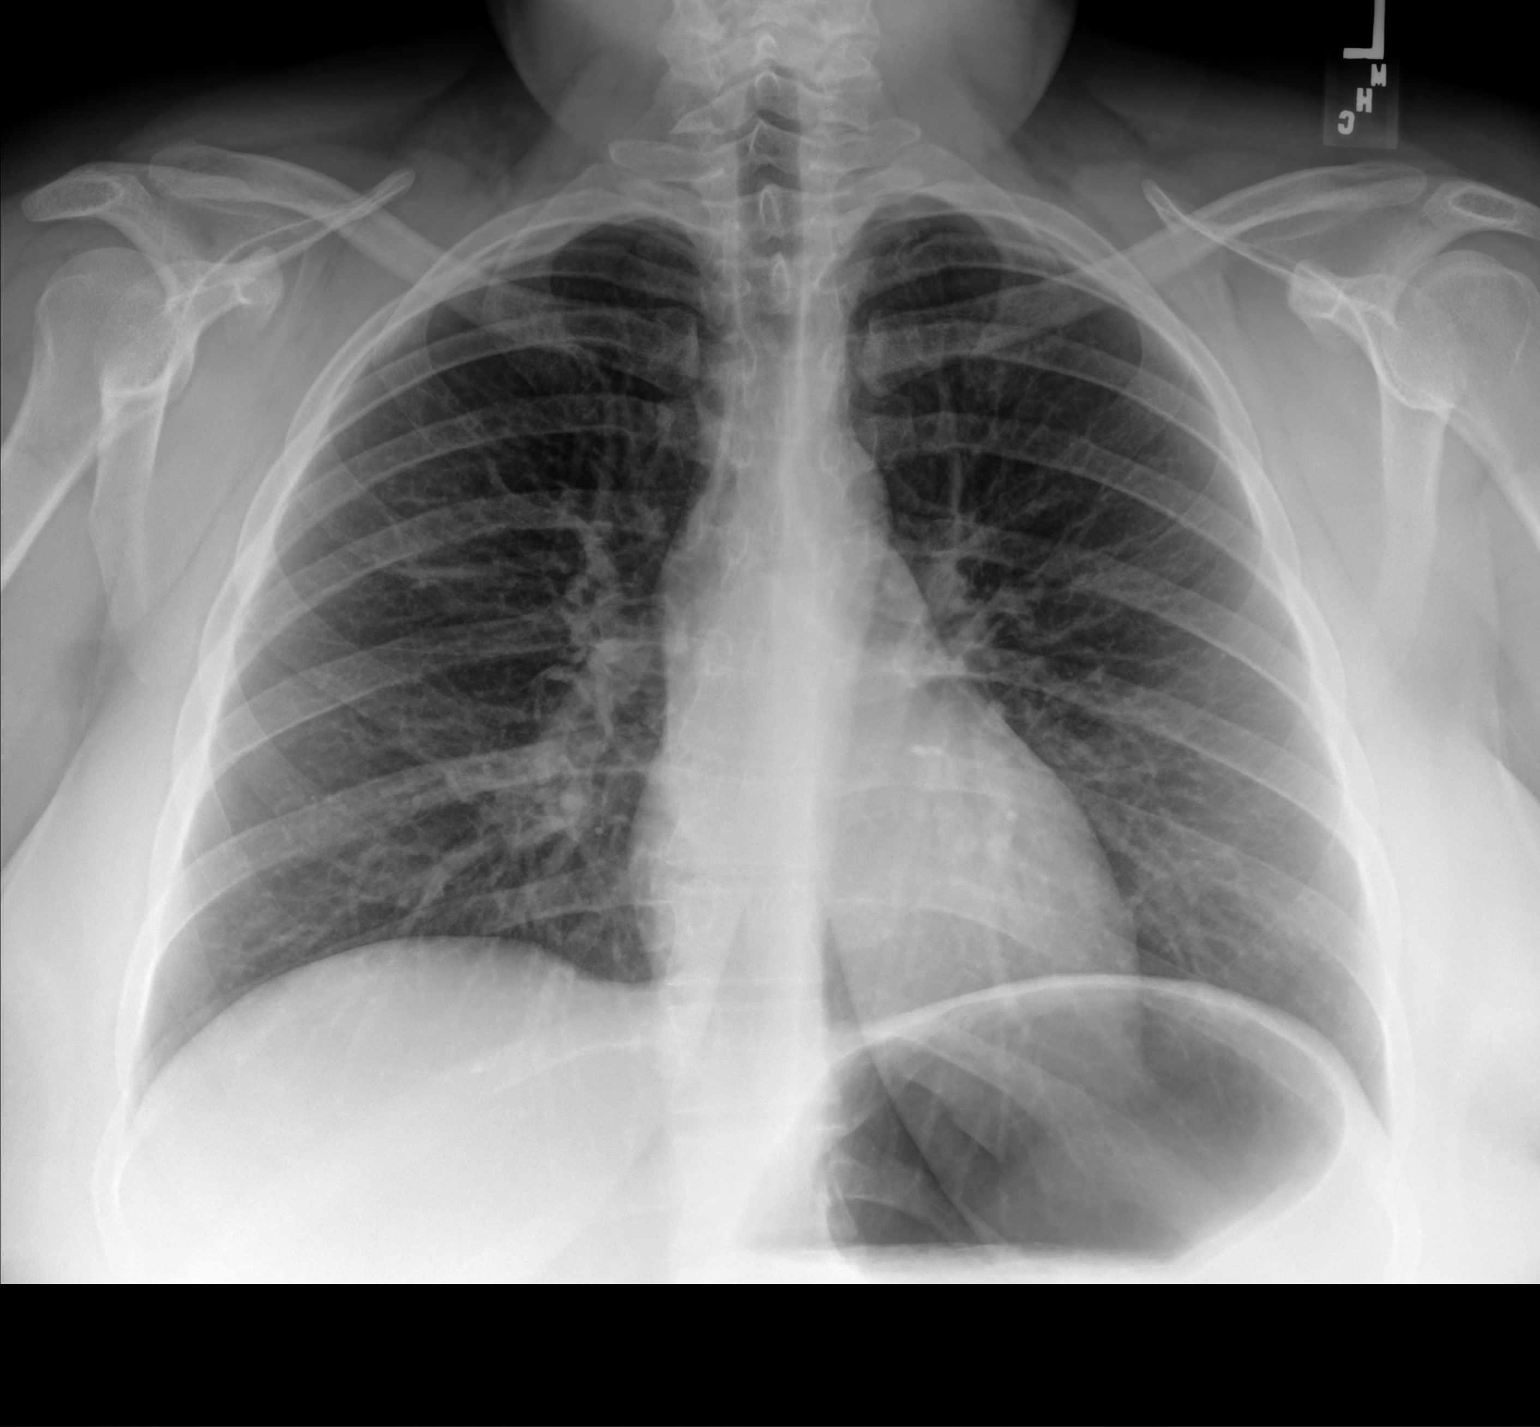

[chest lat]
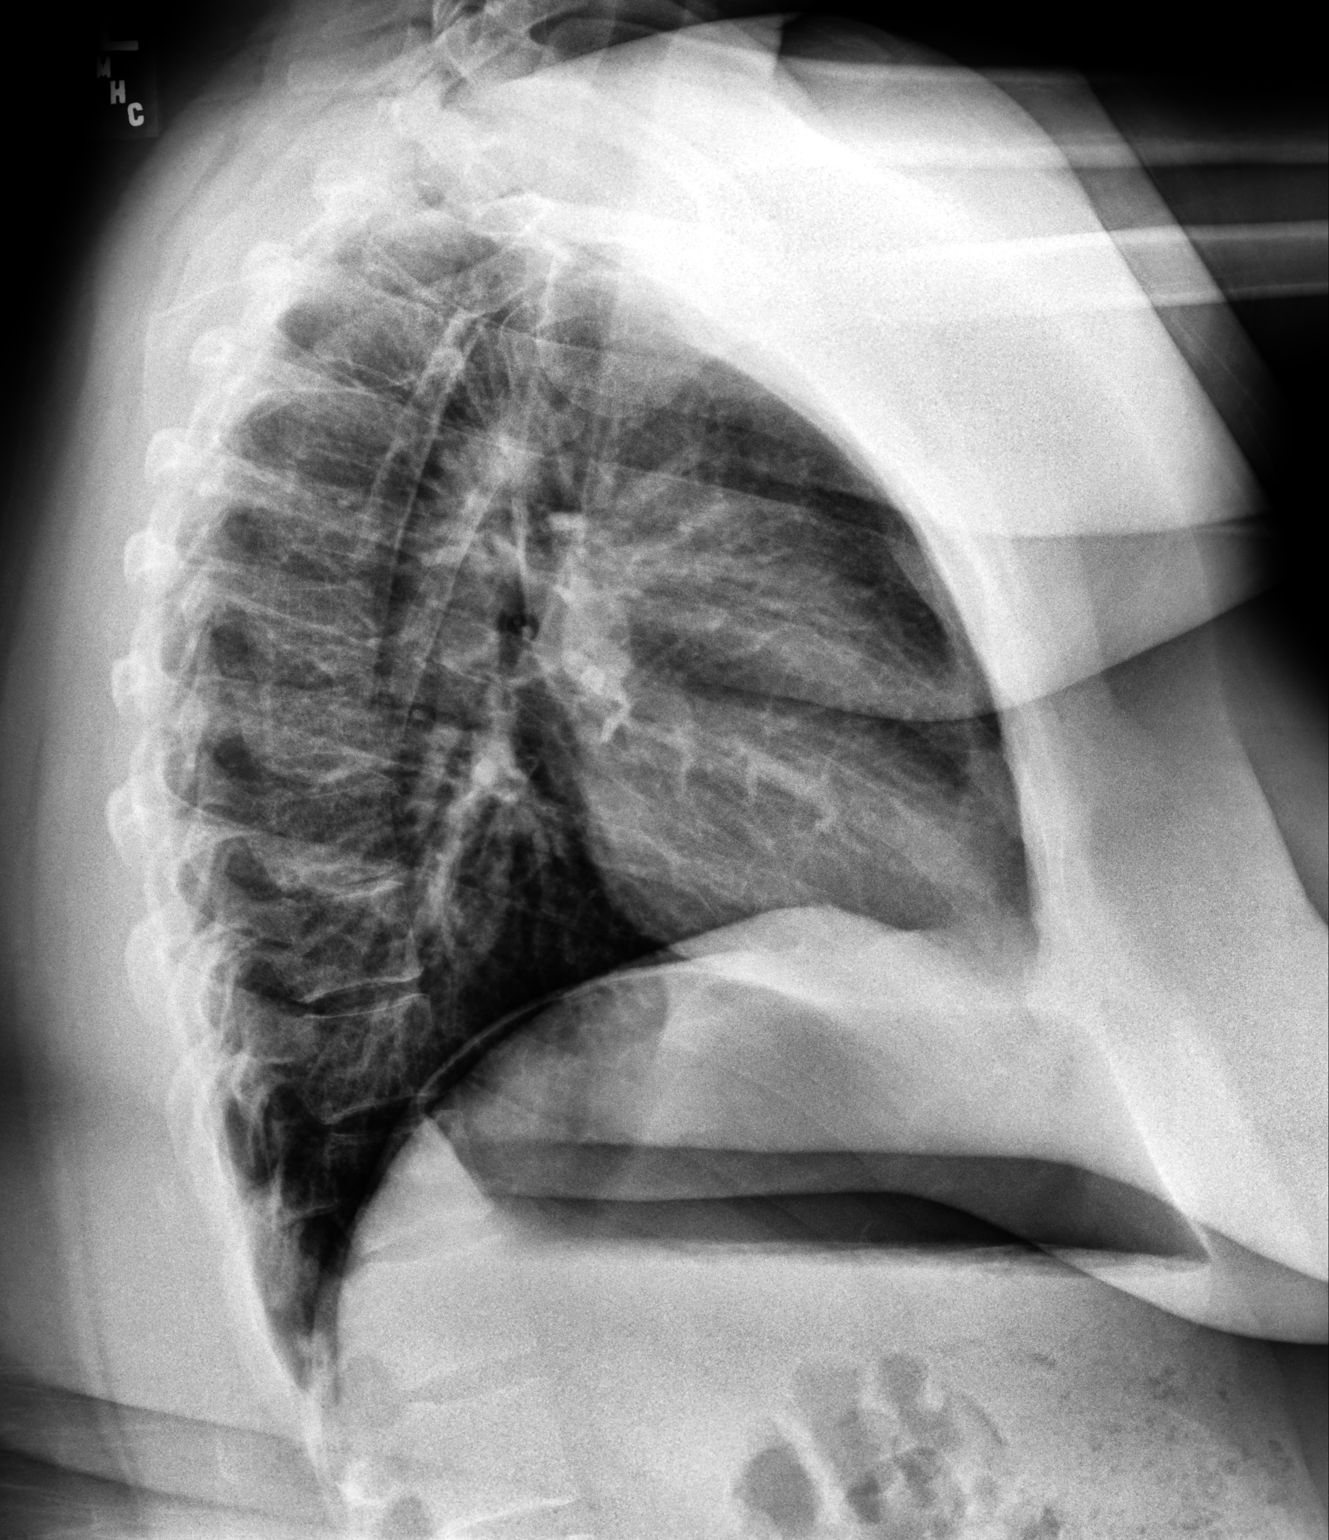

[2 of 2 positions shown; findings below may reference images not displayed]

FINDINGS: The heart size and mediastinal contours are within normal limits.
Both lungs are clear. The visualized skeletal structures are
unremarkable.
IMPRESSION: No active cardiopulmonary disease.

## 2023-01-16 ENCOUNTER — Telehealth: Payer: BC Managed Care – PPO | Admitting: Physician Assistant

## 2023-01-16 DIAGNOSIS — B9689 Other specified bacterial agents as the cause of diseases classified elsewhere: Secondary | ICD-10-CM

## 2023-01-16 DIAGNOSIS — J019 Acute sinusitis, unspecified: Secondary | ICD-10-CM | POA: Diagnosis not present

## 2023-01-17 MED ORDER — AMOXICILLIN-POT CLAVULANATE 875-125 MG PO TABS: 1.0000 | ORAL_TABLET | Freq: Two times a day (BID) | ORAL | 0 refills | Status: DC

## 2023-01-17 NOTE — Progress Notes (Signed)

## 2023-01-17 NOTE — Progress Notes (Signed)
I have spent 5 minutes in review of e-visit questionnaire, review and updating patient chart, medical decision making and response to patient.   Elloise Roark Cody Melba Araki, PA-C    

## 2023-01-20 ENCOUNTER — Other Ambulatory Visit: Payer: Self-pay | Admitting: Internal Medicine

## 2023-02-17 ENCOUNTER — Telehealth: Payer: BC Managed Care – PPO | Admitting: Nurse Practitioner

## 2023-02-17 DIAGNOSIS — J019 Acute sinusitis, unspecified: Secondary | ICD-10-CM

## 2023-02-17 DIAGNOSIS — B9689 Other specified bacterial agents as the cause of diseases classified elsewhere: Secondary | ICD-10-CM

## 2023-02-18 MED ORDER — DOXYCYCLINE HYCLATE 100 MG PO TABS: 100.0000 mg | ORAL_TABLET | Freq: Two times a day (BID) | ORAL | 0 refills | Status: DC

## 2023-02-18 NOTE — Progress Notes (Signed)

## 2023-04-06 ENCOUNTER — Telehealth: Payer: BC Managed Care – PPO | Admitting: Physician Assistant

## 2023-04-06 DIAGNOSIS — K047 Periapical abscess without sinus: Secondary | ICD-10-CM | POA: Diagnosis not present

## 2023-04-06 MED ORDER — PENICILLIN V POTASSIUM 500 MG PO TABS: 500.0000 mg | ORAL_TABLET | Freq: Three times a day (TID) | ORAL | 0 refills | Status: AC

## 2023-04-06 MED ORDER — NAPROXEN 500 MG PO TABS: 500.0000 mg | ORAL_TABLET | Freq: Two times a day (BID) | ORAL | 0 refills | Status: DC

## 2023-04-06 NOTE — Progress Notes (Signed)
E-Visit for Dental Pain  We are sorry that you are not feeling well.  Here is how we plan to help!  Based on what you have shared with me in the questionnaire, it sounds like you have a dental infection. I have prescribed Pen VK 500mg  3 times a day for 7 days and Naprosyn 500mg  2 times a day for 7 days for discomfort. If you find you are pregnant while on the Naprosyn, stop and switch to Tylenol until you speak with your PCP or dental provider.   It is imperative that you see a dentist within 10 days of this eVisit to determine the cause of the dental pain and be sure it is adequately treated  A toothache or tooth pain is caused when the nerve in the root of a tooth or surrounding a tooth is irritated. Dental (tooth) infection, decay, injury, or loss of a tooth are the most common causes of dental pain. Pain may also occur after an extraction (tooth is pulled out). Pain sometimes originates from other areas and radiates to the jaw, thus appearing to be tooth pain.Bacteria growing inside your mouth can contribute to gum disease and dental decay, both of which can cause pain. A toothache occurs from inflammation of the central portion of the tooth called pulp. The pulp contains nerve endings that are very sensitive to pain. Inflammation to the pulp or pulpitis may be caused by dental cavities, trauma, and infection.    HOME CARE:   For toothaches: Over-the-counter pain medications such as acetaminophen or ibuprofen may be used. Take these as directed on the package while you arrange for a dental appointment. Avoid very cold or hot foods, because they may make the pain worse. You may get relief from biting on a cotton ball soaked in oil of cloves. You can get oil of cloves at most drug stores.  For jaw pain:  Aspirin may be helpful for problems in the joint of the jaw in adults. If pain happens every time you open your mouth widely, the temporomandibular joint (TMJ) may be the source of the pain.  Yawning or taking a large bite of food may worsen the pain. An appointment with your doctor or dentist will help you find the cause.     GET HELP RIGHT AWAY IF:  You have a high fever or chills If you have had a recent head or face injury and develop headache, light headedness, nausea, vomiting, or other symptoms that concern you after an injury to your face or mouth, you could have a more serious injury in addition to your dental injury. A facial rash associated with a toothache: This condition may improve with medication. Contact your doctor for them to decide what is appropriate. Any jaw pain occurring with chest pain: Although jaw pain is most commonly caused by dental disease, it is sometimes referred pain from other areas. People with heart disease, especially people who have had stents placed, people with diabetes, or those who have had heart surgery may have jaw pain as a symptom of heart attack or angina. If your jaw or tooth pain is associated with lightheadedness, sweating, or shortness of breath, you should see a doctor as soon as possible. Trouble swallowing or excessive pain or bleeding from gums: If you have a history of a weakened immune system, diabetes, or steroid use, you may be more susceptible to infections. Infections can often be more severe and extensive or caused by unusual organisms. Dental and gum infections  in people with these conditions may require more aggressive treatment. An abscess may need draining or IV antibiotics, for example.  MAKE SURE YOU   Understand these instructions. Will watch your condition. Will get help right away if you are not doing well or get worse.  Thank you for choosing an e-visit.  Your e-visit answers were reviewed by a board certified advanced clinical practitioner to complete your personal care plan. Depending upon the condition, your plan could have included both over the counter or prescription medications.  Please review your  pharmacy choice. Make sure the pharmacy is open so you can pick up prescription now. If there is a problem, you may contact your provider through [employer] and have the prescription routed to another pharmacy.  Your safety is important to [place]. If you have drug allergies check your prescription carefully.   For the next 24 hours you can use MyChart to ask questions about today's visit, request a non-urgent call back, or ask for a work or school excuse. You will get an email in the next two days asking about your experience. I hope that your e-visit has been valuable and will speed your recovery.

## 2023-04-06 NOTE — Progress Notes (Signed)
I have spent 5 minutes in review of e-visit questionnaire, review and updating patient chart, medical decision making and response to patient.   William Cody Martin, PA-C    

## 2023-04-19 ENCOUNTER — Telehealth: Payer: Self-pay | Admitting: Physician Assistant

## 2023-04-19 DIAGNOSIS — K047 Periapical abscess without sinus: Secondary | ICD-10-CM

## 2023-04-20 NOTE — Progress Notes (Signed)
Because of ongoing concerns for continued infection, I feel your condition warrants further evaluation and I recommend that you be seen in a face to face visit. You will need to reach out to your PCP or be evaluated in person at a local urgent care.   NOTE: There will be NO CHARGE for this eVisit   If you are having a true medical emergency please call 911.      For an urgent face to face visit, [place] has eight urgent care centers for your convenience:   NEW!! [facility] at [facility] Get Driving Directions [phone] [address], [address] [place], [address]    [facility]gent [facility] Center at [facility] Get Driving Directions [phone] [address] [place], [place] [address]   [facility] Urgent Care Center [facility]) Get Driving Directions [phone] [address] [place], [place] [address]  [facility] Urgent Care Center [facility]) Get Driving Directions [phone] [address] [place],  [place]  [address]  [facility] Urgent Care Center [facility] - at [employer]  [phone] [address] [place],  [place] [address]   [facility] Health Urgent Care at [facility] Get Driving Directions [phone] [address], [address] [place], [place] [address]   [facility] Health Urgent Care at [facility] Get Driving Directions  [phone] [address] [place], [place] [address]   [facility] Health Urgent Care at [facility] Directions [phone] [address] [place], [place] [address]  Your MyChart E-visit questionnaire answers were reviewed by a board certified advanced clinical practitioner to complete your personal care plan based on your specific symptoms.  Thank you for using e-Visits.

## 2023-04-21 ENCOUNTER — Other Ambulatory Visit: Payer: Self-pay | Admitting: Internal Medicine

## 2023-04-24 ENCOUNTER — Telehealth: Payer: BC Managed Care – PPO | Admitting: Physician Assistant

## 2023-04-24 DIAGNOSIS — J019 Acute sinusitis, unspecified: Secondary | ICD-10-CM

## 2023-04-24 DIAGNOSIS — B9689 Other specified bacterial agents as the cause of diseases classified elsewhere: Secondary | ICD-10-CM

## 2023-04-24 MED ORDER — AMOXICILLIN-POT CLAVULANATE 875-125 MG PO TABS: 1.0000 | ORAL_TABLET | Freq: Two times a day (BID) | ORAL | 0 refills | Status: DC

## 2023-04-24 NOTE — Progress Notes (Signed)
E-Visit for Sinus Problems  We are sorry that you are not feeling well.  Here is how we plan to help!  Based on what you have shared with me it looks like you have sinusitis.  Sinusitis is inflammation and infection in the sinus cavities of the head.  Based on your presentation I believe you most likely have Acute Bacterial Sinusitis.  This is an infection caused by bacteria and is treated with antibiotics. I have prescribed Augmentin 875mg/125mg one tablet twice daily with food, for 7 days. You may use an oral decongestant such as Mucinex D or if you have glaucoma or high blood pressure use plain Mucinex. Saline nasal spray help and can safely be used as often as needed for congestion.  If you develop worsening sinus pain, fever or notice severe headache and vision changes, or if symptoms are not better after completion of antibiotic, please schedule an appointment with a health care provider.    Sinus infections are not as easily transmitted as other respiratory infection, however we still recommend that you avoid close contact with loved ones, especially the very young and elderly.  Remember to wash your hands thoroughly throughout the day as this is the number one way to prevent the spread of infection!  Home Care: Only take medications as instructed by your medical team. Complete the entire course of an antibiotic. Do not take these medications with alcohol. A steam or ultrasonic humidifier can help congestion.  You can place a towel over your head and breathe in the steam from hot water coming from a faucet. Avoid close contacts especially the very young and the elderly. Cover your mouth when you cough or sneeze. Always remember to wash your hands.  Get Help Right Away If: You develop worsening fever or sinus pain. You develop a severe head ache or visual changes. Your symptoms persist after you have completed your treatment plan.  Make sure you Understand these instructions. Will watch  your condition. Will get help right away if you are not doing well or get worse.  Thank you for choosing an e-visit.  Your e-visit answers were reviewed by a board certified advanced clinical practitioner to complete your personal care plan. Depending upon the condition, your plan could have included both over the counter or prescription medications.  Please review your pharmacy choice. Make sure the pharmacy is open so you can pick up prescription now. If there is a problem, you may contact your provider through MyChart messaging and have the prescription routed to another pharmacy.  Your safety is important to us. If you have drug allergies check your prescription carefully.   For the next 24 hours you can use MyChart to ask questions about today's visit, request a non-urgent call back, or ask for a work or school excuse. You will get an email in the next two days asking about your experience. I hope that your e-visit has been valuable and will speed your recovery.  I have spent 5 minutes in review of e-visit questionnaire, review and updating patient chart, medical decision making and response to patient.   Jennifer M Burnette, PA-C  

## 2023-05-13 ENCOUNTER — Telehealth: Payer: BC Managed Care – PPO | Admitting: Nurse Practitioner

## 2023-05-13 DIAGNOSIS — B9789 Other viral agents as the cause of diseases classified elsewhere: Secondary | ICD-10-CM | POA: Diagnosis not present

## 2023-05-13 DIAGNOSIS — J329 Chronic sinusitis, unspecified: Secondary | ICD-10-CM | POA: Diagnosis not present

## 2023-05-13 MED ORDER — AZELASTINE-FLUTICASONE 137-50 MCG/ACT NA SUSP: 1.0000 | Freq: Two times a day (BID) | NASAL | 0 refills | Status: DC

## 2023-05-13 NOTE — Progress Notes (Signed)
Since you were recently treated for sinus infection 14 days ago we would not treat with antibiotic at this time. I would recommend nasal saline irrigation and a nose spray which I will send to the pharmacy.   If no improvement please follow up in office with your PCP for face to face evaluation and possible ENT referral for re occurring sinus symptoms.    Providers prescribe antibiotics to treat infections caused by bacteria. Antibiotics are very powerful in treating bacterial infections when they are used properly. To maintain their effectiveness, they should be used only when necessary. Overuse of antibiotics has resulted in the development of superbugs that are resistant to treatment!    After careful review of your answers, I would not recommend an antibiotic for your condition.  Antibiotics are not effective against viruses and therefore should not be used to treat them. Common examples of infections caused by viruses include colds and flu   E-Visit for Sinus Problems  We are sorry that you are not feeling well.  Here is how we plan to help!  Based on what you have shared with me it looks like you have sinusitis.  Sinusitis is inflammation and infection in the sinus cavities of the head.  Based on your presentation I believe you most likely have Acute Viral Sinusitis.This is an infection most likely caused by a virus. There is not specific treatment for viral sinusitis other than to help you with the symptoms until the infection runs its course.  You may use an oral decongestant such as Mucinex D or if you have glaucoma or high blood pressure use plain Mucinex. Saline nasal spray help and can safely be used as often as needed for congestion, I have prescribed: Fluticasone nasal spray two sprays in each nostril once a day and Azelastine nasal spray 2 sprays in each nostril twice a day  Some authorities believe that zinc sprays or the use of Echinacea may shorten the course of your  symptoms.  Sinus infections are not as easily transmitted as other respiratory infection, however we still recommend that you avoid close contact with loved ones, especially the very young and elderly.  Remember to wash your hands thoroughly throughout the day as this is the number one way to prevent the spread of infection!  Home Care: Only take medications as instructed by your medical team. Do not take these medications with alcohol. A steam or ultrasonic humidifier can help congestion.  You can place a towel over your head and breathe in the steam from hot water coming from a faucet. Avoid close contacts especially the very young and the elderly. Cover your mouth when you cough or sneeze. Always remember to wash your hands.  Get Help Right Away If: You develop worsening fever or sinus pain. You develop a severe head ache or visual changes. Your symptoms persist after you have completed your treatment plan.  Make sure you Understand these instructions. Will watch your condition. Will get help right away if you are not doing well or get worse.   Thank you for choosing an e-visit.  Your e-visit answers were reviewed by a board certified advanced clinical practitioner to complete your personal care plan. Depending upon the condition, your plan could have included both over the counter or prescription medications.  Please review your pharmacy choice. Make sure the pharmacy is open so you can pick up prescription now. If there is a problem, you may contact your provider through [employer] and have  the prescription routed to another pharmacy.  Your safety is important to [place]. If you have drug allergies check your prescription carefully.   For the next 24 hours you can use MyChart to ask questions about today's visit, request a non-urgent call back, or ask for a work or school excuse. You will get an email in the next two days asking about your experience. I hope that your e-visit has  been valuable and will speed your recovery.

## 2023-05-13 NOTE — Progress Notes (Signed)
I have spent 5 minutes in review of e-visit questionnaire, review and updating patient chart, medical decision making and response to patient.  ° °Zelda W Fleming, NP ° °  °

## 2023-05-17 ENCOUNTER — Other Ambulatory Visit: Payer: Self-pay | Admitting: Internal Medicine

## 2023-05-17 NOTE — Telephone Encounter (Signed)
LMTCB. Pt has not been seen in a year and will need to schedule an appt with Dr. Lorin Picket.

## 2023-08-01 ENCOUNTER — Telehealth: Payer: BC Managed Care – PPO | Admitting: Family Medicine

## 2023-08-01 DIAGNOSIS — J0141 Acute recurrent pansinusitis: Secondary | ICD-10-CM

## 2023-08-01 NOTE — Progress Notes (Signed)
Because you have been treated several times in the last 6 months due to sinus infections and use of antibiotics along with IUI, we want to make sure you are getting the best treatment. Your condition warrants further evaluation and I recommend that you be seen in a face to face visit at your PCP office or at the local urgent care.   NOTE: There will be NO CHARGE for this eVisit   If you are having a true medical emergency please call 911.

## 2023-09-28 ENCOUNTER — Encounter: Payer: Self-pay | Admitting: Emergency Medicine

## 2023-09-28 ENCOUNTER — Ambulatory Visit
Admission: RE | Admit: 2023-09-28 | Discharge: 2023-09-28 | Disposition: A | Discharge status: Home / Self Care | Payer: 59 | Source: Ambulatory Visit | Attending: Emergency Medicine | Admitting: Emergency Medicine

## 2023-09-28 ENCOUNTER — Other Ambulatory Visit: Payer: Self-pay | Admitting: Emergency Medicine

## 2023-09-28 DIAGNOSIS — O3680X1 Pregnancy with inconclusive fetal viability, fetus 1: Secondary | ICD-10-CM | POA: Diagnosis present

## 2023-10-06 DIAGNOSIS — O0993 Supervision of high risk pregnancy, unspecified, third trimester: Secondary | ICD-10-CM | POA: Insufficient documentation

## 2023-10-11 LAB — OB RESULTS CONSOLE RUBELLA ANTIBODY, IGM: Rubella: IMMUNE

## 2023-10-11 LAB — OB RESULTS CONSOLE HEPATITIS B SURFACE ANTIGEN: Hepatitis B Surface Ag: NEGATIVE

## 2023-10-11 LAB — OB RESULTS CONSOLE VARICELLA ZOSTER ANTIBODY, IGG: Varicella: IMMUNE

## 2023-12-06 DIAGNOSIS — F419 Anxiety disorder, unspecified: Secondary | ICD-10-CM | POA: Diagnosis present

## 2023-12-06 DIAGNOSIS — O99210 Obesity complicating pregnancy, unspecified trimester: Secondary | ICD-10-CM | POA: Diagnosis present

## 2024-01-08 ENCOUNTER — Ambulatory Visit: Admission: EM | Admit: 2024-01-08 | Discharge: 2024-01-08 | Disposition: A | Discharge status: Home / Self Care

## 2024-01-08 DIAGNOSIS — W57XXXA Bitten or stung by nonvenomous insect and other nonvenomous arthropods, initial encounter: Secondary | ICD-10-CM

## 2024-01-08 DIAGNOSIS — S00461A Insect bite (nonvenomous) of right ear, initial encounter: Secondary | ICD-10-CM

## 2024-01-08 DIAGNOSIS — S0991XA Unspecified injury of ear, initial encounter: Secondary | ICD-10-CM | POA: Diagnosis not present

## 2024-01-08 NOTE — ED Provider Notes (Signed)
 [facility]    CSN: [id number] Arrival date & time: 01/08/24  5284      History   Chief Complaint Chief Complaint  Patient presents with   Insect Bite    HPI Sarah Harvey is a 28 y.o. female.   HPI  28 year old female with past medical history significant for asthma, depression, GERD, POTS, seizures, and syncope and who is currently [redacted] weeks pregnant presents for evaluation of a tick bite to her right ear.  She reports that she noticed the tick on her ear this morning and removed it.  She states that the tick was flat.  She denies any fever, headache, or joint pain.  Also no rashes.  Past Medical History:  Diagnosis Date   Asthma    Depression    GERD (gastroesophageal reflux disease)    History of migraine    POTS (postural orthostatic tachycardia syndrome)    Seizures (HCC)    Syncope     Patient Active Problem List   Diagnosis Date Noted   Encounter for completion of form with patient 06/05/2022   Major depressive disorder, single episode, mild (HCC) 05/29/2022   Menstrual changes 01/25/2022   Thrombocytosis 05/29/2019   POTS (postural orthostatic tachycardia syndrome) 12/15/2018   History of migraine 12/15/2018   Asthma 12/15/2018   Mild depression 12/15/2018   GERD (gastroesophageal reflux disease) 12/15/2018   Fatigue 12/11/2018   Partial scapholunate tear 08/06/2013   Contusion of unspecified wrist, initial encounter 07/09/2013   Sprain of wrist 07/09/2013   Wrist pain 07/09/2013   Syncope and collapse 06/14/2013   Tachycardia 06/14/2013    Past Surgical History:  Procedure Laterality Date   WRIST SURGERY      OB History     Gravida  1   Para      Term      Preterm      AB      Living         SAB      IAB      Ectopic      Multiple      Live Births               Home Medications    Prior to Admission medications   Medication Sig Start Date End Date Taking? Authorizing Provider  folic acid  (FOLVITE) 1 MG tablet Take 1 tablet (1 mg total) by mouth once daily 11/14/23 11/13/24 Yes [provider]  metFORMIN (GLUCOPHAGE) 1000 MG tablet 1/2 TAB EVERY DAY WEEK 1, 1/2 TAB TWICE A DAY WEEK 2, 1 TAB QM AND 1/2 TAB AT BEDTIME WEEK 3, 1 TAB TWICE A DAY WEEK 4. 05/05/23  Yes [provider]  sertraline  (ZOLOFT ) 50 MG tablet TAKE 1/2 TABLET BY MOUTH DAILY X 1 WEEK AND THEN INCREASE TO 1 TABLET PER DAY. 04/24/23  Yes Dellar Fenton, MD  amoxicillin -clavulanate (AUGMENTIN ) 875-125 MG tablet Take 1 tablet by mouth 2 (two) times daily. 04/24/23   Angelia Kelp, PA-C  Azelastine -Fluticasone  137-50 MCG/ACT SUSP Place 1 spray into the nose every 12 (twelve) hours. 05/13/23   Collins Dean, NP    Family History Family History  Problem Relation Age of Onset   Arthritis Mother    Anxiety disorder Mother    Miscarriages / [place] Mother    Alcohol abuse Father    Depression Father    Drug abuse Father    Diabetes Father    Hyperlipidemia Father    Hypertension  Father    Arthritis Maternal Grandmother    Depression Maternal Grandmother    Hyperlipidemia Maternal Grandmother    Hypertension Maternal Grandmother    Learning disabilities Maternal Grandmother    Cancer Maternal Grandfather    Heart disease Maternal Grandfather    Hyperlipidemia Maternal Grandfather    Cancer Paternal Grandfather     Social History Social History   Tobacco Use   Smoking status: Never   Smokeless tobacco: Never  Vaping Use   Vaping status: Never Used  Substance Use Topics   Alcohol use: Yes    Comment: every now and then   Drug use: No     Allergies   Sulfa antibiotics   Review of Systems Review of Systems  Constitutional:  Negative for fever.  Musculoskeletal:  Negative for arthralgias and myalgias.  Skin:  Positive for wound. Negative for rash.  Neurological:  Negative for headaches.     Physical Exam Triage Vital Signs ED Triage Vitals [01/08/24 0833]   Encounter Vitals Group     BP      Systolic BP Percentile      Diastolic BP Percentile      Pulse      Resp 16     Temp      Temp Source Oral     SpO2      Weight      Height      Head Circumference      Peak Flow      Pain Score      Pain Loc      Pain Education      Exclude from Growth Chart    No data found.  Updated Vital Signs BP 117/78 (BP Location: Left Arm)   Pulse 88   Temp 98.5 F (36.9 C) (Oral)   Resp 16   Ht 5\' 4"  (1.626 m)   Wt 259 lb (117.5 kg)   SpO2 98%   BMI 44.46 kg/m   Visual Acuity Right Eye Distance:   Left Eye Distance:   Bilateral Distance:    Right Eye Near:   Left Eye Near:    Bilateral Near:     Physical Exam Vitals and nursing note reviewed.  Constitutional:      Appearance: Normal appearance. She is not ill-appearing.  HENT:     Head: Normocephalic and atraumatic.  Skin:    General: Skin is warm and dry.     Capillary Refill: Capillary refill takes less than 2 seconds.     Findings: No erythema or rash.     Comments: Small scab in pinna of right ear.  Neurological:     General: No focal deficit present.     Mental Status: She is alert and oriented to person, place, and time.      UC Treatments / Results  Labs (all labs ordered are listed, but only abnormal results are displayed) Labs Reviewed - No data to display  EKG   Radiology No results found.  Procedures Procedures (including critical care time)  Medications Ordered in UC Medications - No data to display  Initial Impression / Assessment and Plan / UC Course  I have reviewed the triage vital signs and the nursing notes.  Pertinent labs & imaging results that were available during my care of the patient were reviewed by me and considered in my medical decision making (see chart for details).   Patient is a pleasant, nontoxic-appearing 28 year old female presenting for evaluation following a tick bite  to her right ear as outlined HPI above.  Patient is  seen image above, there is a small scab in the Scapha of the right ear.  When visualized under magnification there are no retained gel parts of the tick present.  There is mild erythema surrounding the tissue but it is not hot or tender.  No erythema migrans rash or other rashes present.  Given that the tick was flat I have low concern for possible tickborne illness and due to the fact that the patient is [redacted] weeks pregnant will not do routine prophylaxis.  She has been advised to monitor for any fever, headache, joint pain, muscle pain, or rash development.  If any of those conditions arise that she should either return for reevaluation or see her OB/GYN.   Final Clinical Impressions(s) / UC Diagnoses   Final diagnoses:  Right ear injury, initial encounter  Tick bite of right ear, initial encounter     Discharge Instructions      Keep the site of the bite clean and dry.  You do not need to apply any antibiotic ointment.  Monitor for development of any of the following symptoms, joint pain, headache, muscle pain, fever, or rashes.  If any of these conditions arise you need to be reevaluated either here at urgent care or by your OB/GYN.     ED Prescriptions   None    PDMP not reviewed this encounter.   Kent Pear, NP 01/08/24 [phone]

## 2024-01-08 NOTE — Discharge Instructions (Signed)
 Keep the site of the bite clean and dry.  You do not need to apply any antibiotic ointment.  Monitor for development of any of the following symptoms, joint pain, headache, muscle pain, fever, or rashes.  If any of these conditions arise you need to be reevaluated either here at urgent care or by your OB/GYN.

## 2024-01-08 NOTE — ED Triage Notes (Signed)
 Pt c/o tick bite in R ear x1 day. States she took it out. Pt currently 25 wks preg.

## 2024-01-17 ENCOUNTER — Ambulatory Visit
Admission: RE | Admit: 2024-01-17 | Discharge: 2024-01-17 | Disposition: A | Discharge status: Home / Self Care | Source: Ambulatory Visit | Attending: Family Medicine | Admitting: Family Medicine

## 2024-01-17 VITALS — BP 120/87 | HR 108 | Temp 99.8°F | Resp 16 | Ht 64.0 in | Wt 259.0 lb

## 2024-01-17 DIAGNOSIS — Z3A25 25 weeks gestation of pregnancy: Secondary | ICD-10-CM | POA: Insufficient documentation

## 2024-01-17 DIAGNOSIS — Z1152 Encounter for screening for COVID-19: Secondary | ICD-10-CM | POA: Insufficient documentation

## 2024-01-17 DIAGNOSIS — W57XXXD Bitten or stung by nonvenomous insect and other nonvenomous arthropods, subsequent encounter: Secondary | ICD-10-CM | POA: Diagnosis not present

## 2024-01-17 DIAGNOSIS — O98512 Other viral diseases complicating pregnancy, second trimester: Secondary | ICD-10-CM | POA: Diagnosis not present

## 2024-01-17 DIAGNOSIS — B9789 Other viral agents as the cause of diseases classified elsewhere: Secondary | ICD-10-CM | POA: Insufficient documentation

## 2024-01-17 DIAGNOSIS — O99512 Diseases of the respiratory system complicating pregnancy, second trimester: Secondary | ICD-10-CM | POA: Insufficient documentation

## 2024-01-17 DIAGNOSIS — R059 Cough, unspecified: Secondary | ICD-10-CM | POA: Diagnosis not present

## 2024-01-17 DIAGNOSIS — R6889 Other general symptoms and signs: Secondary | ICD-10-CM

## 2024-01-17 DIAGNOSIS — J069 Acute upper respiratory infection, unspecified: Secondary | ICD-10-CM | POA: Diagnosis not present

## 2024-01-17 LAB — RESP PANEL BY RT-PCR (RSV, FLU A&B, COVID)  RVPGX2
Influenza A by PCR: NEGATIVE
Influenza B by PCR: NEGATIVE
Resp Syncytial Virus by PCR: NEGATIVE
SARS Coronavirus 2 by RT PCR: NEGATIVE

## 2024-01-17 NOTE — Discharge Instructions (Addendum)
 Your COVID, influenza and RSV are negative. You most likely have a viral respiratory infection that will gradually improve over the next 7-10 days. Cough may last up to 3 weeks.    You can take Tylenol  and/or Ibuprofen  as needed for fever reduction and pain relief.    For cough: honey 1/2 to 1 teaspoon (you can dilute the honey in water or another fluid). You can also use guaifenesin and dextromethorphan for cough. You can use a humidifier for chest congestion and cough.  If you don't have a humidifier, you can sit in the bathroom with the hot shower running.      For sore throat: try warm salt water gargles, Mucinex sore throat cough drops or cepacol lozenges, throat spray, warm tea or water with lemon/honey, popsicles or ice, or OTC cold relief medicine for throat discomfort. You can also purchase chloraseptic spray at the pharmacy or dollar store.   For congestion: take a daily anti-histamine like Zyrtec , Claritin, and a oral decongestant, such as pseudoephedrine.  You can also use Flonase  1-2 sprays in each nostril daily. Afrin is also a good option, if you do not have high blood pressure.    It is important to stay hydrated: drink plenty of fluids (water, gatorade/powerade/pedialyte, juices, or teas) to keep your throat moisturized and help further relieve irritation/discomfort.    Return or go to the Emergency Department if symptoms worsen or do not improve in the next few days

## 2024-01-17 NOTE — ED Provider Notes (Signed)
 [facility]    CSN: [id number] Arrival date & time: 01/17/24  1242      History   Chief Complaint Chief Complaint  Patient presents with   Cough    HPI Sarah Harvey is a 28 y.o. female.   HPI  History obtained from the patient. Sarah Harvey presents for cough, nasal congestion and fever for the past 4 days.  Diarrhea lasted for 2 days.  Denies neck pain or rash. Endorses slight headache. Reports temperature of 99.9 F.  Took Tylenol  yesterday. No known sick contacts.   She is currently [redacted]w[redacted]d pregnant. Vomiting is not new for her. Got bit by a tick over a week ago. She found it on her right ear about 8 days ago. She believes it attached to her for less than 24 hours. [place].        Past Medical History:  Diagnosis Date   Asthma    Depression    GERD (gastroesophageal reflux disease)    History of migraine    POTS (postural orthostatic tachycardia syndrome)    Seizures (HCC)    Syncope     Patient Active Problem List   Diagnosis Date Noted   Encounter for completion of form with patient 06/05/2022   Major depressive disorder, single episode, mild (HCC) 05/29/2022   Menstrual changes 01/25/2022   Thrombocytosis 05/29/2019   POTS (postural orthostatic tachycardia syndrome) 12/15/2018   History of migraine 12/15/2018   Asthma 12/15/2018   Mild depression 12/15/2018   GERD (gastroesophageal reflux disease) 12/15/2018   Fatigue 12/11/2018   Partial scapholunate tear 08/06/2013   Contusion of unspecified wrist, initial encounter 07/09/2013   Sprain of wrist 07/09/2013   Wrist pain 07/09/2013   Syncope and collapse 06/14/2013   Tachycardia 06/14/2013    Past Surgical History:  Procedure Laterality Date   WRIST SURGERY      OB History     Gravida  1   Para      Term      Preterm      AB      Living         SAB      IAB      Ectopic      Multiple      Live Births               Home  Medications    Prior to Admission medications   Medication Sig Start Date End Date Taking? Authorizing Provider  folic acid (FOLVITE) 1 MG tablet Take 1 tablet (1 mg total) by mouth once daily 11/14/23 11/13/24 Yes [provider]  metFORMIN (GLUCOPHAGE) 1000 MG tablet 1/2 TAB EVERY DAY WEEK 1, 1/2 TAB TWICE A DAY WEEK 2, 1 TAB QM AND 1/2 TAB AT BEDTIME WEEK 3, 1 TAB TWICE A DAY WEEK 4. 05/05/23  Yes [provider]  sertraline  (ZOLOFT ) 50 MG tablet TAKE 1/2 TABLET BY MOUTH DAILY X 1 WEEK AND THEN INCREASE TO 1 TABLET PER DAY. 04/24/23  Yes Dellar Fenton, MD  Azelastine -Fluticasone  137-50 MCG/ACT SUSP Place 1 spray into the nose every 12 (twelve) hours. 05/13/23   Collins Dean, NP    Family History Family History  Problem Relation Age of Onset   Arthritis Mother    Anxiety disorder Mother    Miscarriages / [place] Mother    Alcohol abuse Father    Depression Father    Drug abuse Father  Diabetes Father    Hyperlipidemia Father    Hypertension Father    Arthritis Maternal Grandmother    Depression Maternal Grandmother    Hyperlipidemia Maternal Grandmother    Hypertension Maternal Grandmother    Learning disabilities Maternal Grandmother    Cancer Maternal Grandfather    Heart disease Maternal Grandfather    Hyperlipidemia Maternal Grandfather    Cancer Paternal Grandfather     Social History Social History   Tobacco Use   Smoking status: Never   Smokeless tobacco: Never  Vaping Use   Vaping status: Never Used  Substance Use Topics   Alcohol use: Yes    Comment: every now and then   Drug use: No     Allergies   Sulfa antibiotics   Review of Systems Review of Systems: negative unless otherwise stated in HPI.      Physical Exam Triage Vital Signs ED Triage Vitals  Encounter Vitals Group     BP 01/17/24 1339 120/87     Systolic BP Percentile --      Diastolic BP Percentile --      Pulse Rate 01/17/24 1339 (!) 108     Resp 01/17/24  1339 16     Temp 01/17/24 1339 99.8 F (37.7 C)     Temp Source 01/17/24 1339 Oral     SpO2 01/17/24 1339 97 %     Weight 01/17/24 1338 259 lb (117.5 kg)     Height 01/17/24 1338 5\' 4"  (1.626 m)     Head Circumference --      Peak Flow --      Pain Score 01/17/24 1342 6     Pain Loc --      Pain Education --      Exclude from Growth Chart --    No data found.  Updated Vital Signs BP 120/87 (BP Location: Right Arm)   Pulse (!) 108   Temp 99.8 F (37.7 C) (Oral)   Resp 16   Ht 5\' 4"  (1.626 m)   Wt 117.5 kg   SpO2 97%   BMI 44.46 kg/m   Visual Acuity Right Eye Distance:   Left Eye Distance:   Bilateral Distance:    Right Eye Near:   Left Eye Near:    Bilateral Near:     Physical Exam GEN:     alert, non-toxic appearing female in no distress    HENT:  mucus membranes moist, oropharyngeal without lesions or erythema, no tonsillar hypertrophy or exudates, no nasal discharge EYES:   pupils equal and reactive, no scleral injection or discharge NECK:  normal ROM, no lymphadenopathy, no meningismus   RESP:  no increased work of breathing, clear to auscultation bilaterally CVS:   regular rhythm, tachycardic Skin:   warm and dry, no rash BUE, BLE, abdomen and trunk, vitiligo spots present     UC Treatments / Results  Labs (all labs ordered are listed, but only abnormal results are displayed) Labs Reviewed  RESP PANEL BY RT-PCR (RSV, FLU A&B, COVID)  RVPGX2  TICK[place]OR[place] DISEASE ANTIBODY PROFILE, SERUM  MISC [facility] TEST (SEND OUT)    EKG   Radiology No results found.  Procedures Procedures (including critical care time)  Medications Ordered in UC Medications - No data to display  Initial Impression / Assessment and Plan / UC Course  I have reviewed the triage vital signs and the nursing notes.  Pertinent labs & imaging results that were available during my care of the patient were reviewed  by me and considered in my medical decision making (see chart for  details).       Pt is a 28 y.o. female who is 89 presents for respiratory symptoms for the past 4 days. Sarah Harvey has an elevated temperature here of 99.7F.  Satting well on room air. Overall pt is ill but non-toxic appearing, well hydrated, without respiratory distress. Pulmonary exam is unremarkable.  COVID, RSV and influenza panel obtained and was negative.  History consistent with viral respiratory illness. Discussed symptomatic treatment.  Explained lack of efficacy of antibiotics in viral disease.  Typical duration of symptoms discussed.   Pt does not have a rash but has flu-like symptoms. She is pregnant. Ordered tickborne disease panel. Defer antibiotics for now.   Return and ED precautions given and voiced understanding. Discussed MDM, treatment plan and plan for follow-up with patient who agrees with plan.     Final Clinical Impressions(s) / UC Diagnoses   Final diagnoses:  Flu-like symptoms  Viral URI with cough  Tick bite, unspecified site, subsequent encounter     Discharge Instructions      Your COVID, influenza and RSV are negative. You most likely have a viral respiratory infection that will gradually improve over the next 7-10 days. Cough may last up to 3 weeks.    You can take Tylenol  and/or Ibuprofen  as needed for fever reduction and pain relief.    For cough: honey 1/2 to 1 teaspoon (you can dilute the honey in water or another fluid). You can also use guaifenesin and dextromethorphan for cough. You can use a humidifier for chest congestion and cough.  If you don't have a humidifier, you can sit in the bathroom with the hot shower running.      For sore throat: try warm salt water gargles, Mucinex sore throat cough drops or cepacol lozenges, throat spray, warm tea or water with lemon/honey, popsicles or ice, or OTC cold relief medicine for throat discomfort. You can also purchase chloraseptic spray at the pharmacy or dollar store.   For congestion: take a daily  anti-histamine like Zyrtec , Claritin, and a oral decongestant, such as pseudoephedrine.  You can also use Flonase  1-2 sprays in each nostril daily. Afrin is also a good option, if you do not have high blood pressure.    It is important to stay hydrated: drink plenty of fluids (water, gatorade/powerade/pedialyte, juices, or teas) to keep your throat moisturized and help further relieve irritation/discomfort.    Return or go to the Emergency Department if symptoms worsen or do not improve in the next few days      ED Prescriptions   None    PDMP not reviewed this encounter.   Fidel Huddle, DO 01/17/24 1701

## 2024-01-17 NOTE — ED Triage Notes (Addendum)
 Pt c/o cough & congestion x4 days. No OTC meds.  Also c/o diarrhea x2 days. Concerned about it coming from tick bite 1 wk ago.

## 2024-01-18 LAB — MISC LABCORP TEST (SEND OUT): Labcorp test code: 164705

## 2024-01-30 LAB — OB RESULTS CONSOLE RPR: RPR: NONREACTIVE

## 2024-01-30 LAB — OB RESULTS CONSOLE HIV ANTIBODY (ROUTINE TESTING): HIV: NONREACTIVE

## 2024-04-04 LAB — OB RESULTS CONSOLE GC/CHLAMYDIA
Chlamydia: NEGATIVE
Neisseria Gonorrhea: NEGATIVE

## 2024-04-04 LAB — OB RESULTS CONSOLE GBS: GBS: NEGATIVE

## 2024-04-11 ENCOUNTER — Other Ambulatory Visit: Payer: Self-pay

## 2024-04-11 ENCOUNTER — Encounter: Payer: Self-pay | Admitting: Obstetrics and Gynecology

## 2024-04-11 ENCOUNTER — Observation Stay
Admission: EM | Admit: 2024-04-11 | Discharge: 2024-04-11 | Disposition: A | Discharge status: Home / Self Care | Source: Ambulatory Visit | Attending: Obstetrics | Admitting: Obstetrics

## 2024-04-11 DIAGNOSIS — Z369 Encounter for antenatal screening, unspecified: Secondary | ICD-10-CM | POA: Diagnosis not present

## 2024-04-11 DIAGNOSIS — O368930 Maternal care for other specified fetal problems, third trimester, not applicable or unspecified: Principal | ICD-10-CM | POA: Insufficient documentation

## 2024-04-11 DIAGNOSIS — Z3A37 37 weeks gestation of pregnancy: Secondary | ICD-10-CM | POA: Insufficient documentation

## 2024-04-11 DIAGNOSIS — O288 Other abnormal findings on antenatal screening of mother: Principal | ICD-10-CM | POA: Diagnosis present

## 2024-04-11 NOTE — OB Triage Note (Signed)
 Patient sent to L&D for further evaluation due to non reactive NST in office.

## 2024-04-11 NOTE — Discharge Summary (Signed)
 Sarah Harvey is a 28 y.o. female. She is at [redacted]w[redacted]d gestation. No LMP recorded. Patient is pregnant. Estimated Date of Delivery: 04/26/24  Prenatal care site: [facility]  Chief complaint: non reactive NST  HPI: Sarah Harvey presents to L&D for a non reactive NST  during a routine PNC visit.  Factors complicating pregnancy: Patient Active Problem List   Diagnosis Date Noted   NST (non-stress test) nonreactive 04/11/2024   Encounter for completion of form with patient 06/05/2022   Major depressive disorder, single episode, mild (HCC) 05/29/2022   Menstrual changes 01/25/2022   Thrombocytosis 05/29/2019   POTS (postural orthostatic tachycardia syndrome) 12/15/2018   History of migraine 12/15/2018   Asthma 12/15/2018   Mild depression 12/15/2018   GERD (gastroesophageal reflux disease) 12/15/2018   Fatigue 12/11/2018   Partial scapholunate tear 08/06/2013   Contusion of unspecified wrist, initial encounter 07/09/2013   Sprain of wrist 07/09/2013   Wrist pain 07/09/2013   Syncope and collapse 06/14/2013   Tachycardia 06/14/2013     S: Resting comfortably. no CTX, no VB.no LOF,  Active fetal movement.   Maternal Medical History:  Past Medical Hx:  has a past medical history of Asthma, Depression, GERD (gastroesophageal reflux disease), History of migraine, POTS (postural orthostatic tachycardia syndrome), Seizures (HCC), and Syncope.    Past Surgical Hx:  has a past surgical history that includes Wrist surgery and Wisdom tooth extraction (Bilateral, 2024).   Allergies  Allergen Reactions   Sulfa Antibiotics Hives    Other reaction(s): Unknown     Prior to Admission medications   Medication Sig Start Date End Date Taking? Authorizing Provider  aspirin EC 81 MG tablet Take 81 mg by mouth daily. Swallow whole.   Yes [provider]  folic acid (FOLVITE) 1 MG tablet Take 1 tablet (1 mg total) by mouth once daily 11/14/23 11/13/24 Yes [provider]   metFORMIN (GLUCOPHAGE) 1000 MG tablet 1/2 TAB EVERY DAY WEEK 1, 1/2 TAB TWICE A DAY WEEK 2, 1 TAB QM AND 1/2 TAB AT BEDTIME WEEK 3, 1 TAB TWICE A DAY WEEK 4. 05/05/23  Yes [provider]  Prenatal Vit-Fe Fumarate-FA (MULTIVITAMIN-PRENATAL) 27-0.8 MG TABS tablet Take 1 tablet by mouth daily at 12 noon.   Yes [provider]  sertraline  (ZOLOFT ) 50 MG tablet TAKE 1/2 TABLET BY MOUTH DAILY X 1 WEEK AND THEN INCREASE TO 1 TABLET PER DAY. 04/24/23  Yes Glendia Shad, MD  Azelastine -Fluticasone  137-50 MCG/ACT SUSP Place 1 spray into the nose every 12 (twelve) hours. Patient not taking: Reported on 04/11/2024 05/13/23   Theotis Haze ORN, NP    Social History: She  reports that she has never smoked. She has never used smokeless tobacco. She reports current alcohol use. She reports that she does not use drugs.  Family History: family history includes Alcohol abuse in her father; Anxiety disorder in her mother; Arthritis in her maternal grandmother and mother; Cancer in her maternal grandfather and paternal grandfather; Depression in her father and maternal grandmother; Diabetes in her father; Drug abuse in her father; Heart disease in her maternal grandfather; Hyperlipidemia in her father, maternal grandfather, and maternal grandmother; Hypertension in her father and maternal grandmother; Learning disabilities in her maternal grandmother; Miscarriages / [place] in her mother. ,no history of gyn cancers  Review of Systems: A full review of systems was performed and negative except as noted in the HPI.    O:  BP (!) 119/91   Pulse (!) 104  Temp 98 F (36.7 C) (Oral)   Resp 17   Ht 5' 4 (1.626 m)   Wt 121.6 kg   BMI 46.00 kg/m  No results found for this or any previous visit (from the past 48 hours).   Constitutional: NAD, AAOx3  HE/ENT: extraocular movements grossly intact, moist mucous membranes CV: RRR PULM: nl respiratory effort, CTABL Abd: gravid, non-tender,  non-distended, soft  Ext: Non-tender, Nonedmeatous Psych: mood appropriate, speech normal Pelvic : deferred SVE:     NST:non reactive NST Baseline FHR: 135 beats/min Variability: moderate Accelerations: present Decelerations: absent Tocometry: quiet Time: at least 20 minutes   Interpretation: Category I INDICATIONS: non reactive NST RESULTS:  A NST procedure was performed with FHR monitoring and a normal baseline established, appropriate time of 20-40 minutes of evaluation, and accels >2 seen w 15x15 characteristics.  Results show a REACTIVE NST.    Imaging Studies: No results found.  Assessment: 28 y.o. [redacted]w[redacted]d here for antenatal surveillance during pregnancy.  Principle diagnosis: NST There were no encounter diagnoses.   Plan: Labor: not present.  Fetal Wellbeing: Reassuring Cat 1 tracing. Reactive NST  D/c home stable, precautions reviewed, follow-up as scheduled.   ----- Bobbette Brunswick, CNM Certified Nurse Midwife [facility]

## 2024-04-13 ENCOUNTER — Encounter: Payer: Self-pay | Admitting: Urgent Care

## 2024-04-16 NOTE — H&P (Signed)
 OB History & Physical   History of Present Illness:  Chief Complaint: Here for cesarean delivery  HPI:  Sarah Harvey is a 28 y.o. G1P0000 female at [redacted]w[redacted]d dated by LMP consistent with early ultrasound.  Her pregnancy has been complicated by obesity with BMI > 40, POTS, pregnancy achieved with IUI.    She denies contractions.   She denies leakage of fluid.   She denies vaginal bleeding.   She reports fetal movement.    Total weight gain for pregnancy: -4.536 kg (-10 lb)   Obstetrical Problem List: Pregnancy Problems (from 10/11/23 to present)     Problem Noted Diagnosed Resolved   Supervision of high risk pregnancy in third trimester (HHS-HCC) 10/06/2023 by Wilfred Bloom, CMA  No   Overview Addendum 04/16/2024  4:24 PM by Wilfred Bloom, CMA  28 y.o. G1P0000 at  Patient's last menstrual period was 07/22/2023 (exact date). consistent with ultrasound on 09/28/23 @ [redacted]w[redacted]d. Estimated Date of Delivery:04/27/24 Sex of baby and name:  Jet   Partner:    Dusty Factors complicating this pregnancy  [facility] scheduled: 04/23/24 w/SDJ Obesity: BMI 43.8 at NOB Early 1h gtt: 141, 3 hr gtt 11/15/23: F82, 124, 146, 81  bASA after 12 weeks--> Starting taking aspirin @ 14 weeks (11/08/2023) MFM anatomy Antepartum testing  Growth u/s at 28, 32, 36 weeks Weekly NST at 34 weeks  Uterine size-dates discrepancy Measuring 37wks @ 32wks Growth US  scheduled 03/18/24 POTS - MFM consult (Video with [facility]) - Completed 11/14/23, Maternal Echo completed 11/21/23 Pregnancy achieved with IUI Anxiety: taking sertraline  at NOB--> taking 50 mg daily (11/08/2023) NOB BP 122/85 - monitor.  Screening results and needs: NOB:  Medicaid Questionnaire:n/a []  ACHD Program Depression Score:0  MBT: A POS   Ab screen:neg    HIV:   RPR:NR   Hep B:neg  Hep C:NR  Pap:09/06/2022 nilm G/C:neg/neg  Rubella:immune    CSC:pfflwz  TSH:0.639     A1C:5.3  PCR:89  Aneuploidy:  First trimester:  MaternitT21: 10/11/23 negative   CF/SMA carrier screening: neg/neg Second trimester (AFP/tetra): neg 28 weeks:  Depression Score:  12 Blood consent: Signed AMM 02/02/24  Hgb:12.2  Platelets:391    Glucola:3 HR GTT:    Rhogam:  RPR:NR HIV:neg 36 weeks:  GBS:neg   G/C:neg/neg  Hgb:12.3  Platelets:449     Last US :  09/01/23: crown rump length 2.8, gestational age [redacted]w[redacted]d, yolk sac length 4mm. Right Ovary length 98.34mm width:70.35mm height 79.6mm volume 290.69mL. Left ovary L33.34mm W:29.78mm, H:25.67mm,Volume 13.3 mL. 09/11/23: crown rump length:11.3 mm, gestational age [redacted]w[redacted]d, yolk sac length:4.3 mm, FHR 149. Right ovary length: 23.78mm width:27.3 height 18.105mm volume:6.2L. Left Ovary length 33.38mm, width:29.75mm, height:25.19mm, volume 13.3L 09/18/23: crown rump length 17 mm, calculated gestational age [redacted]w[redacted]d, yolk sac length 6.2 mm, fhr 178. Right ovary 33.38mm width:38mm, height: 28.3 mm volume 16.105mL. Left ovary:79mm width 37.4 height 25.23mm volume 17.9 mL 09/28/23: Intrauterine gestational sac: Single Yolk sac:  Visualized, Embryo:  Visualized, Cardiac Activity: Visualized, Heart Rate: 178 bpm, CRL:27.8 mm [redacted]w[redacted]d, US  EDC: 04/28/2024  12/15/23: Cardiac activity Present. Presentation: Breech Placenta: Placental site: Anterior. No evidence of previa Umbilical cord: Cord vessels: 3 vessel cord, Amniotic fluid: Amount of AF: Normal, follow up views of aortic arch, ductal arch and LVOT in about 2 -3 weeks. 01/05/24: f/u to anatomy.  Ductal arch, aortic arch and LVOT were visualized and appear normal. Other fetal anatomy views appear normal or have been documented previously.  02/15/24: EFW 3lb 11 oz (1682 g) 77%,  AC 55%, FHR 141 bpm, Cervical length 4.35 cm, AFI 21.6 cm, Cephalic presentation, Anterior placenta. 03/18/24: Cardiac activity Present, FHR 154 bpm, Presentation: Cephalic, Placenta: Anterior, No evidence of previa, EFW 2,472g at 50%ile, Umbilical cord: 3 vessel cord, Amount of AF: Normal, AFI 21.3 cm, MVP 5.9 cm, Abdominal Circumference  percentile is at 40%  Immunization:   Flu in season - given 11/08/23 JW Tdap at 27-36 weeks - Given 02/02/24 TD RSV at 32-36 weeks -  Contraception Plan: Vasectomy  Feeding Plan:  Labor Plans: [facility], scheduled 04/23/2024 with SDJ                Maternal Medical History:   Past Medical History:  Diagnosis Date   Anxiety 03/01/2016   taking Zoloft  generic 50 mg daily   Asthma (HHS-HCC)    Contusion of right wrist 07/09/2013   due to sprain   Fatigue    GERD (gastroesophageal reflux disease)    History of migraine    Infertility management    Menstrual changes 01/25/2022   stopped OCPs 06/2021, no cycle since 08/2021 other than spotting in late 09/2021   Mild depression    Palpitations    Partial scapholunate tear 08/06/2013   right wrist   PCOS (polycystic ovarian syndrome)    POTS (postural orthostatic tachycardia syndrome) 12/15/2018   Psychological trauma    Right wrist fracture    7th grade   Syncope    02/09/15, 09/28/15, and 04/19/16   Syncope and collapse 06/14/2013   Last episode October 26, 2016   Tachycardia 06/14/2013    Past Surgical History:  Procedure Laterality Date   Right wrist arthroscopy with debridement of scapholunate tear and dorsal capsular tear Right 08/27/2013   Dr. Debby Barbara    Allergies  Allergen Reactions   Sulfa (Sulfonamide Antibiotics) Hives, Other (See Comments) and Unknown    Prior to Admission medications  Medication Sig Taking? Last Dose  aspirin 81 MG EC tablet Take 81 mg by mouth once daily Yes Taking  folic acid (FOLVITE) 1 MG tablet Take 1 tablet (1 mg total) by mouth once daily Yes Taking  metFORMIN  (GLUCOPHAGE ) 1000 MG tablet Take 1 tablet (1,000 mg total) by mouth 2 (two) times daily with meals Yes Taking  ondansetron  (ZOFRAN ) 4 MG tablet TAKE 1 TABLET EVERY 8 HOURS AS NEED FOR NAUSEA Yes Taking  PNV no.95/ferrous fum/folic ac (PRENATAL ORAL) Take by mouth Yes Taking  sertraline  (ZOLOFT ) 50 MG tablet  Take 1 tablet (50 mg total) by mouth once daily. Yes Taking  medroxyPROGESTERone (PROVERA) 10 MG tablet Take 1 tablet (10 mg total) by mouth once daily for 10 days    medroxyPROGESTERone (PROVERA) 10 MG tablet Take 1 tablet (10 mg total) by mouth once daily for 10 days    medroxyPROGESTERone (PROVERA) 10 MG tablet Take 1 tablet (10 mg total) by mouth once daily for 10 days    ondansetron  (ZOFRAN -ODT) 4 MG disintegrating tablet Take 4 mg by mouth every 8 (eight) hours as needed for Nausea Patient not taking: Reported on 04/16/2024  Not Taking  ondansetron  (ZOFRAN -ODT) 4 MG disintegrating tablet Take 1 tablet (4 mg total) by mouth every 8 (eight) hours as needed for Nausea Patient not taking: Reported on 04/16/2024  Not Taking    OB History  Gravida Para Term Preterm AB Living  1 0 0 0 0 0  SAB IAB Ectopic Molar Multiple Live Births  0 0 0 0 0 0    #  Outcome Date GA Lbr Len/2nd Weight Sex Type Anes PTL Lv  1 Current             Prenatal care site: [facility]  Social History: She  reports that she has never smoked. She does not have any smokeless tobacco history on file. She reports that she does not currently use alcohol. She reports that she does not use drugs.  Family History: family history includes Alcohol abuse in her father; Anxiety in her mother; Arthritis in her maternal grandmother and mother; Cancer in her maternal grandfather, mother, and paternal grandfather; Depression in her father and maternal grandmother; Diabetes type II in her father; Drug abuse in her father; Heart disease in her maternal grandfather and mother; Heart failure in her maternal grandfather; Heart murmur in her mother; High blood pressure (Hypertension) in her father, maternal grandfather, maternal grandmother, and mother; Hyperlipidemia (Elevated cholesterol) in her father, maternal grandfather, and maternal grandmother; Irregular Heart Beat (Arrhythmia) in her maternal aunt; Learning disabilities in her  maternal grandmother; Miscarriages / Stillbirths in her mother; Myocardial Infarction (Heart attack) in her maternal grandfather.   Review of Systems  Constitutional: Negative.   HENT: Negative.    Eyes: Negative.   Respiratory: Negative.    Cardiovascular: Negative.   Gastrointestinal: Negative.   Genitourinary: Negative.   Musculoskeletal: Negative.   Skin: Negative.   Neurological: Negative.   Endo/Heme/Allergies: Negative.   Psychiatric/Behavioral: Negative.       Physical Exam:  BP 106/85   Pulse 94   Ht 165.1 cm (5' 5)   Wt (!) 122.5 kg (270 lb)   LMP 07/22/2023 (Exact Date)   BMI 44.93 kg/m   Physical Exam Constitutional:      General: She is not in acute distress.    Appearance: Normal appearance.  HENT:     Head: Normocephalic and atraumatic.  Eyes:     General: No scleral icterus.    Conjunctiva/sclera: Conjunctivae normal.  Cardiovascular:     Rate and Rhythm: Normal rate and regular rhythm.     Heart sounds: No murmur heard.    No friction rub. No gallop.  Pulmonary:     Effort: Pulmonary effort is normal. No respiratory distress.     Breath sounds: Normal breath sounds. No wheezing, rhonchi or rales.  Abdominal:     General: Bowel sounds are normal. There is no distension.     Palpations: Abdomen is soft. There is mass (gravid, NT).     Tenderness: There is no abdominal tenderness. There is no guarding or rebound.  Musculoskeletal:        General: No swelling. Normal range of motion.  Neurological:     General: No focal deficit present.     Mental Status: She is oriented to person, place, and time.     Cranial Nerves: No cranial nerve deficit.  Skin:    General: Skin is warm and dry.     Findings: No lesion.  Psychiatric:        Mood and Affect: Mood normal.        Behavior: Behavior normal.        Judgment: Judgment normal.  Vitals and nursing note reviewed.      Pertinent Results:  Prenatal Labs Blood type/Rh A Positive  Antibody screen  negative  Rubella Immune  Varicella Immune    RPR NR (10/11/23, 01/30/24)  Hep C negative  HBsAg negative  HIV negative (01/30/2024)  GC negative  Chlamydia negative  Genetic screening NIPS negative  1 hour GTT Early 141  3 hour GTT Early (82, 124, 146, 81), 28 weeks (76, 138, 142, 100)   GBS negative on 04/04/24  msAFP - Screen negative CF screen - negative SMA screen - negative  Assessment:  Jarely Juncaj is a 28 y.o. G1P0000 female at [redacted]w[redacted]d with POTS with elective cesarean delivery.   Plan:  Admit to Labor & Delivery  CBC, T&S, NPO, IVF GBS negative.   Previously had long discussion with patient regarding POTS and pregnancy and that POTS is not an indication itself for a cesarean delivery. She would like to proceed after extensive counseling previously performed in the office. Consents signed.   Anny Sayler DANIEL Brennden Masten, MD 04/16/2024 5:18 PM

## 2024-04-16 NOTE — H&P (View-Only) (Signed)
 OB History & Physical   History of Present Illness:  Chief Complaint: Here for cesarean delivery  HPI:  Sarah Harvey is a 28 y.o. G1P0000 female at [redacted]w[redacted]d dated by LMP consistent with early ultrasound.  Her pregnancy has been complicated by obesity with BMI > 40, POTS, pregnancy achieved with IUI.    She denies contractions.   She denies leakage of fluid.   She denies vaginal bleeding.   She reports fetal movement.    Total weight gain for pregnancy: -4.536 kg (-10 lb)   Obstetrical Problem List: Pregnancy Problems (from 10/11/23 to present)     Problem Noted Diagnosed Resolved   Supervision of high risk pregnancy in third trimester (HHS-HCC) 10/06/2023 by Wilfred Bloom, CMA  No   Overview Addendum 04/16/2024  4:24 PM by Wilfred Bloom, CMA  28 y.o. G1P0000 at  Patient's last menstrual period was 07/22/2023 (exact date). consistent with ultrasound on 09/28/23 @ [redacted]w[redacted]d. Estimated Date of Delivery:04/27/24 Sex of baby and name:  Jet   Partner:    Dusty Factors complicating this pregnancy  [facility] Primary LTCS scheduled: 04/23/24 w/SDJ Obesity: BMI 43.8 at NOB Early 1h gtt: 141, 3 hr gtt 11/15/23: F82, 124, 146, 81  bASA after 12 weeks--> Starting taking aspirin @ 14 weeks (11/08/2023) MFM anatomy Antepartum testing  Growth u/s at 28, 32, 36 weeks Weekly NST at 34 weeks  Uterine size-dates discrepancy Measuring 37wks @ 32wks Growth US  scheduled 03/18/24 POTS - MFM consult (Video with [facility]) - Completed 11/14/23, Maternal Echo completed 11/21/23 Pregnancy achieved with IUI Anxiety: taking sertraline  at NOB--> taking 50 mg daily (11/08/2023) NOB BP 122/85 - monitor.  Screening results and needs: NOB:  Medicaid Questionnaire:n/a []  ACHD Program Depression Score:0  MBT: A POS   Ab screen:neg    HIV:   RPR:NR   Hep B:neg  Hep C:NR  Pap:09/06/2022 nilm G/C:neg/neg  Rubella:immune    CSC:pfflwz  TSH:0.639     A1C:5.3  PCR:89  Aneuploidy:  First trimester:  MaternitT21: 10/11/23 negative   CF/SMA carrier screening: neg/neg Second trimester (AFP/tetra): neg 28 weeks:  Depression Score:  12 Blood consent: Signed AMM 02/02/24  Hgb:12.2  Platelets:391    Glucola:3 HR GTT:    Rhogam:  RPR:NR HIV:neg 36 weeks:  GBS:neg   G/C:neg/neg  Hgb:12.3  Platelets:449     Last US :  09/01/23: crown rump length 2.8, gestational age [redacted]w[redacted]d, yolk sac length 4mm. Right Ovary length 98.34mm width:70.35mm height 79.6mm volume 290.69mL. Left ovary L33.34mm W:29.78mm, H:25.67mm,Volume 13.3 mL. 09/11/23: crown rump length:11.3 mm, gestational age [redacted]w[redacted]d, yolk sac length:4.3 mm, FHR 149. Right ovary length: 23.78mm width:27.3 height 18.105mm volume:6.2L. Left Ovary length 33.38mm, width:29.75mm, height:25.19mm, volume 13.3L 09/18/23: crown rump length 17 mm, calculated gestational age [redacted]w[redacted]d, yolk sac length 6.2 mm, fhr 178. Right ovary 33.38mm width:38mm, height: 28.3 mm volume 16.105mL. Left ovary:79mm width 37.4 height 25.23mm volume 17.9 mL 09/28/23: Intrauterine gestational sac: Single Yolk sac:  Visualized, Embryo:  Visualized, Cardiac Activity: Visualized, Heart Rate: 178 bpm, CRL:27.8 mm [redacted]w[redacted]d, US  EDC: 04/28/2024  12/15/23: Cardiac activity Present. Presentation: Breech Placenta: Placental site: Anterior. No evidence of previa Umbilical cord: Cord vessels: 3 vessel cord, Amniotic fluid: Amount of AF: Normal, follow up views of aortic arch, ductal arch and LVOT in about 2 -3 weeks. 01/05/24: f/u to anatomy.  Ductal arch, aortic arch and LVOT were visualized and appear normal. Other fetal anatomy views appear normal or have been documented previously.  02/15/24: EFW 3lb 11 oz (1682 g) 77%,  AC 55%, FHR 141 bpm, Cervical length 4.35 cm, AFI 21.6 cm, Cephalic presentation, Anterior placenta. 03/18/24: Cardiac activity Present, FHR 154 bpm, Presentation: Cephalic, Placenta: Anterior, No evidence of previa, EFW 2,472g at 50%ile, Umbilical cord: 3 vessel cord, Amount of AF: Normal, AFI 21.3 cm, MVP 5.9 cm, Abdominal Circumference  percentile is at 40%  Immunization:   Flu in season - given 11/08/23 JW Tdap at 27-36 weeks - Given 02/02/24 TD RSV at 32-36 weeks -  Contraception Plan: Vasectomy  Feeding Plan:  Labor Plans: [facility] primary LTCS, scheduled 04/23/2024 with SDJ                Maternal Medical History:   Past Medical History:  Diagnosis Date   Anxiety 03/01/2016   taking Zoloft  generic 50 mg daily   Asthma (HHS-HCC)    Contusion of right wrist 07/09/2013   due to sprain   Fatigue    GERD (gastroesophageal reflux disease)    History of migraine    Infertility management    Menstrual changes 01/25/2022   stopped OCPs 06/2021, no cycle since 08/2021 other than spotting in late 09/2021   Mild depression    Palpitations    Partial scapholunate tear 08/06/2013   right wrist   PCOS (polycystic ovarian syndrome)    POTS (postural orthostatic tachycardia syndrome) 12/15/2018   Psychological trauma    Right wrist fracture    7th grade   Syncope    02/09/15, 09/28/15, and 04/19/16   Syncope and collapse 06/14/2013   Last episode October 26, 2016   Tachycardia 06/14/2013    Past Surgical History:  Procedure Laterality Date   Right wrist arthroscopy with debridement of scapholunate tear and dorsal capsular tear Right 08/27/2013   Dr. Debby Barbara    Allergies  Allergen Reactions   Sulfa (Sulfonamide Antibiotics) Hives, Other (See Comments) and Unknown    Prior to Admission medications  Medication Sig Taking? Last Dose  aspirin 81 MG EC tablet Take 81 mg by mouth once daily Yes Taking  folic acid (FOLVITE) 1 MG tablet Take 1 tablet (1 mg total) by mouth once daily Yes Taking  metFORMIN  (GLUCOPHAGE ) 1000 MG tablet Take 1 tablet (1,000 mg total) by mouth 2 (two) times daily with meals Yes Taking  ondansetron  (ZOFRAN ) 4 MG tablet TAKE 1 TABLET EVERY 8 HOURS AS NEED FOR NAUSEA Yes Taking  PNV no.95/ferrous fum/folic ac (PRENATAL ORAL) Take by mouth Yes Taking  sertraline  (ZOLOFT ) 50 MG tablet  Take 1 tablet (50 mg total) by mouth once daily. Yes Taking  medroxyPROGESTERone (PROVERA) 10 MG tablet Take 1 tablet (10 mg total) by mouth once daily for 10 days    medroxyPROGESTERone (PROVERA) 10 MG tablet Take 1 tablet (10 mg total) by mouth once daily for 10 days    medroxyPROGESTERone (PROVERA) 10 MG tablet Take 1 tablet (10 mg total) by mouth once daily for 10 days    ondansetron  (ZOFRAN -ODT) 4 MG disintegrating tablet Take 4 mg by mouth every 8 (eight) hours as needed for Nausea Patient not taking: Reported on 04/16/2024  Not Taking  ondansetron  (ZOFRAN -ODT) 4 MG disintegrating tablet Take 1 tablet (4 mg total) by mouth every 8 (eight) hours as needed for Nausea Patient not taking: Reported on 04/16/2024  Not Taking    OB History  Gravida Para Term Preterm AB Living  1 0 0 0 0 0  SAB IAB Ectopic Molar Multiple Live Births  0 0 0 0 0 0    #  Outcome Date GA Lbr Len/2nd Weight Sex Type Anes PTL Lv  1 Current             Prenatal care site: [facility]  Social History: She  reports that she has never smoked. She does not have any smokeless tobacco history on file. She reports that she does not currently use alcohol. She reports that she does not use drugs.  Family History: family history includes Alcohol abuse in her father; Anxiety in her mother; Arthritis in her maternal grandmother and mother; Cancer in her maternal grandfather, mother, and paternal grandfather; Depression in her father and maternal grandmother; Diabetes type II in her father; Drug abuse in her father; Heart disease in her maternal grandfather and mother; Heart failure in her maternal grandfather; Heart murmur in her mother; High blood pressure (Hypertension) in her father, maternal grandfather, maternal grandmother, and mother; Hyperlipidemia (Elevated cholesterol) in her father, maternal grandfather, and maternal grandmother; Irregular Heart Beat (Arrhythmia) in her maternal aunt; Learning disabilities in her  maternal grandmother; Miscarriages / Stillbirths in her mother; Myocardial Infarction (Heart attack) in her maternal grandfather.   Review of Systems  Constitutional: Negative.   HENT: Negative.    Eyes: Negative.   Respiratory: Negative.    Cardiovascular: Negative.   Gastrointestinal: Negative.   Genitourinary: Negative.   Musculoskeletal: Negative.   Skin: Negative.   Neurological: Negative.   Endo/Heme/Allergies: Negative.   Psychiatric/Behavioral: Negative.       Physical Exam:  BP 106/85   Pulse 94   Ht 165.1 cm (5' 5)   Wt (!) 122.5 kg (270 lb)   LMP 07/22/2023 (Exact Date)   BMI 44.93 kg/m   Physical Exam Constitutional:      General: She is not in acute distress.    Appearance: Normal appearance.  HENT:     Head: Normocephalic and atraumatic.  Eyes:     General: No scleral icterus.    Conjunctiva/sclera: Conjunctivae normal.  Cardiovascular:     Rate and Rhythm: Normal rate and regular rhythm.     Heart sounds: No murmur heard.    No friction rub. No gallop.  Pulmonary:     Effort: Pulmonary effort is normal. No respiratory distress.     Breath sounds: Normal breath sounds. No wheezing, rhonchi or rales.  Abdominal:     General: Bowel sounds are normal. There is no distension.     Palpations: Abdomen is soft. There is mass (gravid, NT).     Tenderness: There is no abdominal tenderness. There is no guarding or rebound.  Musculoskeletal:        General: No swelling. Normal range of motion.  Neurological:     General: No focal deficit present.     Mental Status: She is oriented to person, place, and time.     Cranial Nerves: No cranial nerve deficit.  Skin:    General: Skin is warm and dry.     Findings: No lesion.  Psychiatric:        Mood and Affect: Mood normal.        Behavior: Behavior normal.        Judgment: Judgment normal.  Vitals and nursing note reviewed.      Pertinent Results:  Prenatal Labs Blood type/Rh A Positive  Antibody screen  negative  Rubella Immune  Varicella Immune    RPR NR (10/11/23, 01/30/24)  Hep C negative  HBsAg negative  HIV negative (01/30/2024)  GC negative  Chlamydia negative  Genetic screening NIPS negative  1 hour GTT Early 141  3 hour GTT Early (82, 124, 146, 81), 28 weeks (76, 138, 142, 100)   GBS negative on 04/04/24  msAFP - Screen negative CF screen - negative SMA screen - negative  Assessment:  Jarely Juncaj is a 28 y.o. G1P0000 female at [redacted]w[redacted]d with POTS with elective cesarean delivery.   Plan:  Admit to Labor & Delivery  CBC, T&S, NPO, IVF GBS negative.   Previously had long discussion with patient regarding POTS and pregnancy and that POTS is not an indication itself for a cesarean delivery. She would like to proceed after extensive counseling previously performed in the office. Consents signed.   Anny Sayler DANIEL Brennden Masten, MD 04/16/2024 5:18 PM

## 2024-04-19 ENCOUNTER — Inpatient Hospital Stay
Admission: EM | Admit: 2024-04-19 | Discharge: 2024-04-21 | DRG: 787 | Disposition: A | Discharge status: Home / Self Care | Attending: Obstetrics and Gynecology | Admitting: Obstetrics and Gynecology

## 2024-04-19 ENCOUNTER — Inpatient Hospital Stay
Admission: RE | Admit: 2024-04-19 | Discharge: 2024-04-19 | Disposition: A | Discharge status: Home / Self Care | Source: Ambulatory Visit

## 2024-04-19 ENCOUNTER — Encounter
Admission: EM | Disposition: A | Discharge status: Home / Self Care | Payer: Self-pay | Source: Home / Self Care | Attending: Obstetrics and Gynecology

## 2024-04-19 ENCOUNTER — Other Ambulatory Visit: Payer: Self-pay

## 2024-04-19 ENCOUNTER — Inpatient Hospital Stay: Admitting: Certified Registered Nurse Anesthetist

## 2024-04-19 ENCOUNTER — Observation Stay

## 2024-04-19 ENCOUNTER — Encounter: Payer: Self-pay | Admitting: Obstetrics and Gynecology

## 2024-04-19 DIAGNOSIS — O99354 Diseases of the nervous system complicating childbirth: Secondary | ICD-10-CM | POA: Diagnosis present

## 2024-04-19 DIAGNOSIS — Z8249 Family history of ischemic heart disease and other diseases of the circulatory system: Secondary | ICD-10-CM | POA: Diagnosis not present

## 2024-04-19 DIAGNOSIS — G90A Postural orthostatic tachycardia syndrome (POTS): Secondary | ICD-10-CM | POA: Diagnosis present

## 2024-04-19 DIAGNOSIS — Z833 Family history of diabetes mellitus: Secondary | ICD-10-CM

## 2024-04-19 DIAGNOSIS — F419 Anxiety disorder, unspecified: Secondary | ICD-10-CM | POA: Diagnosis present

## 2024-04-19 DIAGNOSIS — Z3A39 39 weeks gestation of pregnancy: Secondary | ICD-10-CM | POA: Diagnosis not present

## 2024-04-19 DIAGNOSIS — O99344 Other mental disorders complicating childbirth: Secondary | ICD-10-CM | POA: Diagnosis present

## 2024-04-19 DIAGNOSIS — O368130 Decreased fetal movements, third trimester, not applicable or unspecified: Secondary | ICD-10-CM | POA: Diagnosis present

## 2024-04-19 DIAGNOSIS — Z79899 Other long term (current) drug therapy: Secondary | ICD-10-CM

## 2024-04-19 DIAGNOSIS — O99210 Obesity complicating pregnancy, unspecified trimester: Secondary | ICD-10-CM | POA: Diagnosis present

## 2024-04-19 DIAGNOSIS — O9962 Diseases of the digestive system complicating childbirth: Secondary | ICD-10-CM | POA: Diagnosis present

## 2024-04-19 DIAGNOSIS — E66813 Obesity, class 3: Secondary | ICD-10-CM | POA: Diagnosis present

## 2024-04-19 DIAGNOSIS — O134 Gestational [pregnancy-induced] hypertension without significant proteinuria, complicating childbirth: Secondary | ICD-10-CM | POA: Diagnosis present

## 2024-04-19 DIAGNOSIS — O99214 Obesity complicating childbirth: Principal | ICD-10-CM | POA: Diagnosis present

## 2024-04-19 DIAGNOSIS — Z8759 Personal history of other complications of pregnancy, childbirth and the puerperium: Secondary | ICD-10-CM | POA: Diagnosis present

## 2024-04-19 DIAGNOSIS — O139 Gestational [pregnancy-induced] hypertension without significant proteinuria, unspecified trimester: Secondary | ICD-10-CM | POA: Diagnosis present

## 2024-04-19 DIAGNOSIS — K219 Gastro-esophageal reflux disease without esophagitis: Secondary | ICD-10-CM | POA: Diagnosis present

## 2024-04-19 HISTORY — DX: Anxiety disorder, unspecified: F41.9

## 2024-04-19 LAB — CBC
HCT: 36.6 % (ref 36.0–46.0)
HCT: 38 % (ref 36.0–46.0)
Hemoglobin: 11.9 g/dL — ABNORMAL LOW (ref 12.0–15.0)
Hemoglobin: 12.9 g/dL (ref 12.0–15.0)
MCH: 28.3 pg (ref 26.0–34.0)
MCH: 29.2 pg (ref 26.0–34.0)
MCHC: 32.5 g/dL (ref 30.0–36.0)
MCHC: 33.9 g/dL (ref 30.0–36.0)
MCV: 87.1 fL (ref 80.0–100.0)
MCV: 86 fL (ref 80.0–100.0)
Platelets: 410 K/uL — ABNORMAL HIGH (ref 150–400)
Platelets: 473 K/uL — ABNORMAL HIGH (ref 150–400)
RBC: 4.2 MIL/uL (ref 3.87–5.11)
RBC: 4.42 MIL/uL (ref 3.87–5.11)
RDW: 13.3 % (ref 11.5–15.5)
RDW: 13.5 % (ref 11.5–15.5)
WBC: 9.5 K/uL (ref 4.0–10.5)
WBC: 10.9 K/uL — ABNORMAL HIGH (ref 4.0–10.5)
nRBC: 0 % (ref 0.0–0.2)
nRBC: 0 % (ref 0.0–0.2)

## 2024-04-19 LAB — COMPREHENSIVE METABOLIC PANEL WITH GFR
ALT: 11 U/L (ref 0–44)
AST: 26 U/L (ref 15–41)
Albumin: 2.8 g/dL — ABNORMAL LOW (ref 3.5–5.0)
Alkaline Phosphatase: 169 U/L — ABNORMAL HIGH (ref 38–126)
Anion gap: 16 — ABNORMAL HIGH (ref 5–15)
BUN: 10 mg/dL (ref 6–20)
CO2: 15 mmol/L — ABNORMAL LOW (ref 22–32)
Calcium: 8.9 mg/dL (ref 8.9–10.3)
Chloride: 106 mmol/L (ref 98–111)
Creatinine, Ser: 0.73 mg/dL (ref 0.44–1.00)
GFR, Estimated: >60 mL/min (ref >60)
Glucose, Bld: 130 mg/dL — ABNORMAL HIGH (ref 70–99)
Potassium: 3.8 mmol/L (ref 3.5–5.1)
Sodium: 137 mmol/L (ref 135–145)
Total Bilirubin: 0.4 mg/dL (ref 0.0–1.2)
Total Protein: 6.5 g/dL (ref 6.5–8.1)

## 2024-04-19 LAB — PROTEIN / CREATININE RATIO, URINE
Creatinine, Urine: 110 mg/dL
Protein Creatinine Ratio: 0.25 mg/mg{creat} — ABNORMAL HIGH (ref 0.00–0.15)
Total Protein, Urine: 27 mg/dL

## 2024-04-19 LAB — TYPE AND SCREEN
ABO/RH(D): A POS
Antibody Screen: NEGATIVE

## 2024-04-19 LAB — ABO/RH: ABO/RH(D): A POS

## 2024-04-19 SURGERY — Surgical Case
Anesthesia: Spinal

## 2024-04-19 SURGERY — Surgical Case
Anesthesia: Spinal | Site: Abdomen

## 2024-04-19 MED ORDER — IBUPROFEN 600 MG PO TABS
600.0000 mg | ORAL_TABLET | Freq: Four times a day (QID) | ORAL | Status: DC
  Administered 2024-04-20 – 2024-04-21 (×6): 600 mg via ORAL
  Filled 2024-04-19 (×6): qty 1

## 2024-04-19 MED ORDER — COCONUT OIL OIL
1.0000 | TOPICAL_OIL | Status: DC | PRN
  Administered 2024-04-20: 1 via TOPICAL
  Filled 2024-04-19: qty 7.5

## 2024-04-19 MED ORDER — KETOROLAC TROMETHAMINE 30 MG/ML IJ SOLN
30.0000 mg | Freq: Four times a day (QID) | INTRAMUSCULAR | Status: DC | PRN
Start: 2024-04-19 — End: 2024-04-20

## 2024-04-19 MED ORDER — FENTANYL CITRATE (PF) 100 MCG/2ML IJ SOLN
INTRAMUSCULAR | Status: DC | PRN
  Administered 2024-04-19: 15 ug via INTRATHECAL

## 2024-04-19 MED ORDER — LACTATED RINGERS IV SOLN: INTRAVENOUS | Status: DC

## 2024-04-19 MED ORDER — SODIUM CHLORIDE 0.9 % IV SOLN
500.0000 mg | INTRAVENOUS | Status: AC
  Administered 2024-04-19: 500 mg via INTRAVENOUS
  Filled 2024-04-19: qty 5

## 2024-04-19 MED ORDER — BUPIVACAINE HCL (PF) 0.5 % IJ SOLN
INTRAMUSCULAR | Status: DC | PRN
  Administered 2024-04-19: 10 mL

## 2024-04-19 MED ORDER — KETOROLAC TROMETHAMINE 30 MG/ML IJ SOLN
INTRAMUSCULAR | Status: DC | PRN
  Administered 2024-04-19: 30 mg via INTRAVENOUS

## 2024-04-19 MED ORDER — KETOROLAC TROMETHAMINE 30 MG/ML IJ SOLN: 30.0000 mg | Freq: Four times a day (QID) | INTRAMUSCULAR | Status: DC | PRN

## 2024-04-19 MED ORDER — DIPHENHYDRAMINE HCL 50 MG/ML IJ SOLN
12.5000 mg | INTRAMUSCULAR | Status: DC | PRN
  Administered 2024-04-19: 12.5 mg via INTRAVENOUS
  Filled 2024-04-19: qty 1

## 2024-04-19 MED ORDER — DIPHENHYDRAMINE HCL 25 MG PO CAPS: 25.0000 mg | ORAL_CAPSULE | ORAL | Status: DC | PRN

## 2024-04-19 MED ORDER — DEXAMETHASONE SODIUM PHOSPHATE 10 MG/ML IJ SOLN
INTRAMUSCULAR | Status: DC | PRN
  Administered 2024-04-19: 10 mg via INTRAVENOUS

## 2024-04-19 MED ORDER — SODIUM CHLORIDE 0.9% FLUSH: 3.0000 mL | INTRAVENOUS | Status: DC | PRN

## 2024-04-19 MED ORDER — DIBUCAINE (PERIANAL) 1 % EX OINT: 1.0000 | TOPICAL_OINTMENT | CUTANEOUS | Status: DC | PRN

## 2024-04-19 MED ORDER — SIMETHICONE 80 MG PO CHEW
80.0000 mg | CHEWABLE_TABLET | Freq: Three times a day (TID) | ORAL | Status: DC
  Administered 2024-04-20 – 2024-04-21 (×5): 80 mg via ORAL
  Filled 2024-04-19 (×5): qty 1

## 2024-04-19 MED ORDER — KETOROLAC TROMETHAMINE 30 MG/ML IJ SOLN
INTRAMUSCULAR | Status: AC
  Filled 2024-04-19: qty 1

## 2024-04-19 MED ORDER — MORPHINE SULFATE (PF) 0.5 MG/ML IJ SOLN
INTRAMUSCULAR | Status: DC | PRN
  Administered 2024-04-19: .1 mg via INTRATHECAL

## 2024-04-19 MED ORDER — PRENATAL MULTIVITAMIN CH
1.0000 | ORAL_TABLET | Freq: Every day | ORAL | Status: DC
  Administered 2024-04-20 – 2024-04-21 (×2): 1 via ORAL
  Filled 2024-04-19 (×2): qty 1

## 2024-04-19 MED ORDER — BUPIVACAINE IN DEXTROSE 0.75-8.25 % IT SOLN
INTRATHECAL | Status: DC | PRN
  Administered 2024-04-19: 1.6 mL via INTRATHECAL

## 2024-04-19 MED ORDER — NALOXONE HCL 0.4 MG/ML IJ SOLN: 0.4000 mg | INTRAMUSCULAR | Status: DC | PRN

## 2024-04-19 MED ORDER — BUPIVACAINE HCL (PF) 0.5 % IJ SOLN: 5.0000 mL | Freq: Once | INTRAMUSCULAR | Status: DC

## 2024-04-19 MED ORDER — OXYTOCIN-SODIUM CHLORIDE 30-0.9 UT/500ML-% IV SOLN
INTRAVENOUS | Status: DC | PRN
Start: 2024-04-19 — End: 2024-04-19
  Administered 2024-04-19: 250 mL/h via INTRAVENOUS

## 2024-04-19 MED ORDER — SODIUM CHLORIDE 0.9 % IV SOLN: 12.5000 mg | INTRAVENOUS | Status: DC | PRN

## 2024-04-19 MED ORDER — EPHEDRINE SULFATE-NACL 50-0.9 MG/10ML-% IV SOSY
PREFILLED_SYRINGE | INTRAVENOUS | Status: DC | PRN
  Administered 2024-04-19 (×2): 5 mg via INTRAVENOUS

## 2024-04-19 MED ORDER — ONDANSETRON HCL 4 MG/2ML IJ SOLN
INTRAMUSCULAR | Status: AC
  Filled 2024-04-19: qty 2

## 2024-04-19 MED ORDER — ONDANSETRON HCL 4 MG/2ML IJ SOLN
INTRAMUSCULAR | Status: DC | PRN
  Administered 2024-04-19: 4 mg via INTRAVENOUS

## 2024-04-19 MED ORDER — FENTANYL CITRATE (PF) 100 MCG/2ML IJ SOLN
INTRAMUSCULAR | Status: AC
  Filled 2024-04-19: qty 2

## 2024-04-19 MED ORDER — DEXAMETHASONE SODIUM PHOSPHATE 10 MG/ML IJ SOLN
INTRAMUSCULAR | Status: AC
  Filled 2024-04-19: qty 1

## 2024-04-19 MED ORDER — OXYCODONE-ACETAMINOPHEN 5-325 MG PO TABS: 1.0000 | ORAL_TABLET | ORAL | Status: DC | PRN

## 2024-04-19 MED ORDER — NALOXONE HCL 4 MG/10ML IJ SOLN: 1.0000 ug/kg/h | INTRAVENOUS | Status: DC | PRN

## 2024-04-19 MED ORDER — OXYCODONE-ACETAMINOPHEN 5-325 MG PO TABS: 2.0000 | ORAL_TABLET | ORAL | Status: DC | PRN

## 2024-04-19 MED ORDER — OXYCODONE HCL 5 MG PO TABS: 5.0000 mg | ORAL_TABLET | Freq: Four times a day (QID) | ORAL | Status: DC | PRN

## 2024-04-19 MED ORDER — SENNOSIDES-DOCUSATE SODIUM 8.6-50 MG PO TABS
2.0000 | ORAL_TABLET | ORAL | Status: DC
  Administered 2024-04-20: 2 via ORAL
  Filled 2024-04-19: qty 2

## 2024-04-19 MED ORDER — CHLORHEXIDINE GLUCONATE 0.12 % MT SOLN
OROMUCOSAL | Status: AC
  Administered 2024-04-19: 15 mL
  Filled 2024-04-19: qty 15

## 2024-04-19 MED ORDER — ACETAMINOPHEN 500 MG PO TABS
1000.0000 mg | ORAL_TABLET | Freq: Four times a day (QID) | ORAL | Status: DC | PRN
  Administered 2024-04-19: 1000 mg via ORAL
  Filled 2024-04-19: qty 2

## 2024-04-19 MED ORDER — OXYTOCIN-SODIUM CHLORIDE 30-0.9 UT/500ML-% IV SOLN
INTRAVENOUS | Status: AC
  Filled 2024-04-19: qty 500

## 2024-04-19 MED ORDER — ONDANSETRON HCL 4 MG/2ML IJ SOLN
4.0000 mg | Freq: Three times a day (TID) | INTRAMUSCULAR | Status: DC | PRN
  Administered 2024-04-19: 4 mg via INTRAVENOUS
  Filled 2024-04-19: qty 2

## 2024-04-19 MED ORDER — MORPHINE SULFATE (PF) 0.5 MG/ML IJ SOLN
INTRAMUSCULAR | Status: AC
  Filled 2024-04-19: qty 10

## 2024-04-19 MED ORDER — CEFAZOLIN SODIUM-DEXTROSE 3-4 GM/150ML-% IV SOLN
3.0000 g | INTRAVENOUS | Status: AC
  Administered 2024-04-19: 3 g via INTRAVENOUS
  Filled 2024-04-19: qty 150

## 2024-04-19 MED ORDER — METFORMIN HCL 500 MG PO TABS
1000.0000 mg | ORAL_TABLET | Freq: Every day | ORAL | Status: DC
  Administered 2024-04-20 – 2024-04-21 (×2): 1000 mg via ORAL
  Filled 2024-04-19 (×3): qty 2

## 2024-04-19 MED ORDER — PHENYLEPHRINE HCL-NACL 20-0.9 MG/250ML-% IV SOLN
INTRAVENOUS | Status: AC
  Filled 2024-04-19: qty 250

## 2024-04-19 MED ORDER — SOD CITRATE-CITRIC ACID 500-334 MG/5ML PO SOLN
30.0000 mL | ORAL | Status: AC
  Administered 2024-04-19: 30 mL via ORAL

## 2024-04-19 MED ORDER — MEPERIDINE HCL 25 MG/ML IJ SOLN: 6.2500 mg | INTRAMUSCULAR | Status: DC | PRN

## 2024-04-19 MED ORDER — FENTANYL CITRATE (PF) 100 MCG/2ML IJ SOLN: 25.0000 ug | INTRAMUSCULAR | Status: DC | PRN

## 2024-04-19 MED ORDER — OXYTOCIN-SODIUM CHLORIDE 30-0.9 UT/500ML-% IV SOLN: 2.5000 [IU]/h | INTRAVENOUS | Status: AC

## 2024-04-19 MED ORDER — BUPIVACAINE HCL (PF) 0.5 % IJ SOLN
5.0000 mL | Freq: Once | INTRAMUSCULAR | Status: DC
  Filled 2024-04-19: qty 10

## 2024-04-19 MED ORDER — BUPIVACAINE 0.25 % ON-Q PUMP DUAL CATH 400 ML
400.0000 mL | INJECTION | Status: DC
  Filled 2024-04-19: qty 400

## 2024-04-19 MED ORDER — LIDOCAINE HCL (PF) 1 % IJ SOLN
INTRAMUSCULAR | Status: DC | PRN
  Administered 2024-04-19: 3 mL via SUBCUTANEOUS

## 2024-04-19 MED ORDER — OXYTOCIN-SODIUM CHLORIDE 30-0.9 UT/500ML-% IV SOLN
INTRAVENOUS | Status: AC
  Administered 2024-04-19: 2.5 [IU]/h via INTRAVENOUS
  Filled 2024-04-19: qty 500

## 2024-04-19 MED ORDER — MENTHOL 3 MG MT LOZG: 1.0000 | LOZENGE | OROMUCOSAL | Status: DC | PRN

## 2024-04-19 MED ORDER — WITCH HAZEL-GLYCERIN EX PADS: 1.0000 | MEDICATED_PAD | CUTANEOUS | Status: DC | PRN

## 2024-04-19 MED ORDER — SERTRALINE HCL 25 MG PO TABS
50.0000 mg | ORAL_TABLET | Freq: Every morning | ORAL | Status: DC
Start: 2024-04-20 — End: 2024-04-21
  Administered 2024-04-20 – 2024-04-21 (×2): 50 mg via ORAL
  Filled 2024-04-19: qty 1
  Filled 2024-04-19 (×2): qty 2

## 2024-04-19 MED ORDER — CALCIUM CARBONATE ANTACID 500 MG PO CHEW: 2.0000 | CHEWABLE_TABLET | ORAL | Status: DC | PRN

## 2024-04-19 MED ORDER — DIPHENHYDRAMINE HCL 25 MG PO CAPS: 25.0000 mg | ORAL_CAPSULE | Freq: Four times a day (QID) | ORAL | Status: DC | PRN

## 2024-04-19 MED ORDER — PHENYLEPHRINE HCL-NACL 20-0.9 MG/250ML-% IV SOLN
INTRAVENOUS | Status: DC | PRN
Start: 2024-04-19 — End: 2024-04-19
  Administered 2024-04-19: 50 ug/min via INTRAVENOUS

## 2024-04-19 MED ORDER — FERROUS SULFATE 325 (65 FE) MG PO TABS
325.0000 mg | ORAL_TABLET | Freq: Two times a day (BID) | ORAL | Status: DC
  Administered 2024-04-20 – 2024-04-21 (×3): 325 mg via ORAL
  Filled 2024-04-19 (×3): qty 1

## 2024-04-19 MED ORDER — SOD CITRATE-CITRIC ACID 500-334 MG/5ML PO SOLN
ORAL | Status: AC
  Filled 2024-04-19: qty 15

## 2024-04-19 SURGICAL SUPPLY — 28 items
BENZOIN TINCTURE PRP APPL 2/3 (GAUZE/BANDAGES/DRESSINGS) ×1 IMPLANT
CATH KIT ON-Q SILVERSOAK 5 (CATHETERS) ×2 IMPLANT
DERMABOND ADVANCED .7 DNX12 (GAUZE/BANDAGES/DRESSINGS) ×1 IMPLANT
DRSG OPSITE POSTOP 4X10 (GAUZE/BANDAGES/DRESSINGS) ×1 IMPLANT
DRSG OPSITE POSTOP 4X12 (GAUZE/BANDAGES/DRESSINGS) IMPLANT
DRSG TELFA 3X8 NADH STRL (GAUZE/BANDAGES/DRESSINGS) ×1 IMPLANT
ELECT CAUTERY BLADE 6.4 (BLADE) ×1 IMPLANT
ELECTRODE REM PT RTRN 9FT ADLT (ELECTROSURGICAL) ×1 IMPLANT
GAUZE SPONGE 4X4 12PLY STRL (GAUZE/BANDAGES/DRESSINGS) ×1 IMPLANT
GLOVE BIO SURGEON STRL SZ7 (GLOVE) ×1 IMPLANT
GLOVE INDICATOR 7.5 STRL GRN (GLOVE) ×1 IMPLANT
GOWN STRL REUS W/ TWL LRG LVL3 (GOWN DISPOSABLE) ×3 IMPLANT
MANIFOLD NEPTUNE II (INSTRUMENTS) ×1 IMPLANT
MAT PREVALON FULL STRYKER (MISCELLANEOUS) ×1 IMPLANT
NS IRRIG 1000ML POUR BTL (IV SOLUTION) ×1 IMPLANT
PACK C SECTION AR (MISCELLANEOUS) ×1 IMPLANT
PAD OB MATERNITY 11 LF (PERSONAL CARE ITEMS) ×2 IMPLANT
PAD PREP OB/GYN DISP 24X41 (PERSONAL CARE ITEMS) ×1 IMPLANT
RTRCTR C-SECT PINK 25CM LRG (MISCELLANEOUS) IMPLANT
SCRUB CHG 4% DYNA-HEX 4OZ (MISCELLANEOUS) ×1 IMPLANT
STAPLER INSORB 30 2030 C-SECTI (MISCELLANEOUS) IMPLANT
STRIP CLOSURE SKIN 1/2X4 (GAUZE/BANDAGES/DRESSINGS) ×1 IMPLANT
SUT PDS AB 1 TP1 96 (SUTURE) ×1 IMPLANT
SUT VIC AB 0 CTX36XBRD ANBCTRL (SUTURE) ×2 IMPLANT
SUTURE MNCRL 4-0 27XMF (SUTURE) ×1 IMPLANT
SUTURE PLN GUT2-0 30 C14 SG823 (SUTURE) IMPLANT
TRAP FLUID SMOKE EVACUATOR (MISCELLANEOUS) ×1 IMPLANT
WATER STERILE IRR 500ML POUR (IV SOLUTION) ×1 IMPLANT

## 2024-04-19 NOTE — Interval H&P Note (Signed)
 History and Physical Interval Note:  04/19/2024 5:06 PM  Sarah Harvey  has presented today for surgery, with the diagnosis of Unscheduled Cesarean Section, See Delivery Summary.  The various methods of treatment have been discussed with the patient and family. After consideration of risks, benefits and other options for treatment, the patient has consented to  Procedure(s): CESAREAN DELIVERY (N/A) as a surgical intervention.  The patient's history has been reviewed, patient examined, no change in status, stable for surgery.  I have reviewed the patient's chart and labs.  Questions were answered to the patient's satisfaction.    See CNM note from today. Patient presented initially for decreased fetal movement and has been diagnosed with gHTN.  Plan to move to delivery at this time. NPO since 0900.  She still wishes to proceed with elective primary c-section after multiple discussions in the office.    Garnette Mace, MD, [facility] 04/19/2024 5:07 PM

## 2024-04-19 NOTE — Pre-Procedure Instructions (Signed)
 Pre admit appointment canceled. Pt currently on L&D unit probable C Section today. She is aware if this does not occur today to contact us  Monday 8/11.

## 2024-04-19 NOTE — OB Triage Note (Signed)
 Pt reports to labor and delivery with complaints of DFM. This started last night around 10pm. She felt very minimal movement throughout the She ate a biscuit and drank sweet tea this morning. She denies vaginal bleeding and LOF. States contractions are occurring irregularly approx 2/hr. EFM and toco applied and assessing.

## 2024-04-19 NOTE — H&P (Signed)
 Interval update to H&P   HPI: update to H&P done for scheduled c/section. Sarah Harvey presented to [facility] triage with decreased fetal movement. NST was reactive and BPP was 8/8. Blood pressures were noted to be elevated. Preeclampsia labs were WNL and Sarah Harvey met criteria for gestational hypertension. We discussed risk/benefits of delivery today versus scheduled c/s next week. At this time we recommend proceeding with delivery today d/t new diagnosis of gestational hypertension. Sarah Harvey desires to proceed with elective primary c/section due to her history of POTS.  Dr. Leonce notified of assessment and delivery plans. Anesthesia team made aware. NPO status 0900. Plan for elective primary c/section at 1700.   Therisa Pillow, CNM Certified Nurse Midwife [facility]

## 2024-04-19 NOTE — Anesthesia Procedure Notes (Signed)
 Spinal  Patient location during procedure: OR Start time: 04/19/2024 5:18 PM End time: 04/19/2024 5:21 PM Reason for block: surgical anesthesia Staffing Performed: resident/CRNA  Anesthesiologist: Dario Barter, MD Resident/CRNA: Delores Evalene BROCKS, CRNA Performed by: Delores Evalene BROCKS, CRNA Authorized by: Dario Barter, MD   Preanesthetic Checklist Completed: patient identified, IV checked, site marked, risks and benefits discussed, surgical consent, monitors and equipment checked, pre-op evaluation and timeout performed Spinal Block Patient position: sitting Prep: Betadine Patient monitoring: heart rate, continuous pulse ox, blood pressure and cardiac monitor Approach: midline Location: L4-5 Injection technique: single-shot Needle Needle type: Whitacre and Introducer  Needle gauge: 24 G Needle length: 9 cm Assessment Events: CSF return Additional Notes Negative paresthesia. Negative blood return. Positive free-flowing CSF. Expiration date of kit checked and confirmed. Patient tolerated procedure well, without complications.

## 2024-04-19 NOTE — Anesthesia Preprocedure Evaluation (Signed)
 Anesthesia Evaluation  Patient identified by MRN, date of birth, ID band Patient awake    Reviewed: Allergy & Precautions, H&P , NPO status , Patient's Chart, lab work & pertinent test results, reviewed documented beta blocker date and time   History of Anesthesia Complications Negative for: history of anesthetic complications  Airway Mallampati: III  TM Distance: >3 FB Neck ROM: full    Dental  (+) Dental Advidsory Given, Missing   Pulmonary neg shortness of breath, asthma (as a child) , neg COPD, neg recent URI   Pulmonary exam normal breath sounds clear to auscultation       Cardiovascular Exercise Tolerance: Good negative cardio ROS Normal cardiovascular exam Rhythm:regular Rate:Normal     Neuro/Psych  PSYCHIATRIC DISORDERS Anxiety Depression    negative neurological ROS     GI/Hepatic Neg liver ROS,GERD  ,,  Endo/Other  neg diabetes  Class 3 obesity  Renal/GU negative Renal ROS  negative genitourinary   Musculoskeletal   Abdominal   Peds  Hematology negative hematology ROS (+)   Anesthesia Other Findings Past Medical History: No date: Anxiety No date: Asthma No date: Depression No date: GERD (gastroesophageal reflux disease) No date: History of migraine No date: POTS (postural orthostatic tachycardia syndrome) No date: Seizures (HCC) No date: Syncope   Reproductive/Obstetrics (+) Pregnancy                              Anesthesia Physical Anesthesia Plan  ASA: 3  Anesthesia Plan: Spinal   Post-op Pain Management:    Induction:   PONV Risk Score and Plan:   Airway Management Planned: Natural Airway  Additional Equipment:   Intra-op Plan:   Post-operative Plan:   Informed Consent: I have reviewed the patients History and Physical, chart, labs and discussed the procedure including the risks, benefits and alternatives for the proposed anesthesia with the patient or  authorized representative who has indicated his/her understanding and acceptance.     Dental Advisory Given  Plan Discussed with: Anesthesiologist, CRNA and Surgeon  Anesthesia Plan Comments:          Anesthesia Quick Evaluation

## 2024-04-19 NOTE — Patient Instructions (Signed)
 Your procedure is scheduled on: Report to the Registration Desk on the 1st floor of the [facility]. To find out your arrival time, please call [phone] between 1PM - 3PM on: If your arrival time is 6:00 am, do not arrive before that time as the [facility] entrance doors do not open until 6:00 am.  REMEMBER: Instructions that are not followed completely may result in serious medical risk, up to and including death; or upon the discretion of your surgeon and anesthesiologist your surgery may need to be rescheduled.  Do not eat food after midnight the night before surgery.  No gum chewing or hard candies.  You may however, drink CLEAR liquids up to 2 hours before you are scheduled to arrive for your surgery. Do not drink anything within 2 hours of your scheduled arrival time.  Clear liquids include: - water   - apple juice without pulp - gatorade (not RED colors) - black coffee or tea (Do NOT add milk or creamers to the coffee or tea) Do NOT drink anything that is not on this list.  **Type 1 and Type 2 diabetics should only drink water .**  In addition, your doctor has ordered for you to drink the provided:  Ensure Pre-Surgery Clear Carbohydrate Drink  Gatorade G2 Drinking this carbohydrate drink up to two hours before surgery helps to reduce insulin resistance and improve patient outcomes. Please complete drinking 2 hours before scheduled arrival time.  One week prior to surgery: Stop Anti-inflammatories (NSAIDS) such as Advil, Aleve, Ibuprofen, Motrin, Naproxen, Naprosyn and Aspirin based products such as Excedrin, Goody's Powder, BC Powder. Stop ANY OVER THE COUNTER supplements until after surgery.  You may however, continue to take Tylenol  if needed for pain up until the day of surgery.  **Follow guidelines for insulin and diabetes medications.**  **Follow recommendations regarding stopping blood thinners.**  Continue taking all of your other prescription medications up  until the day of surgery.  ON THE DAY OF SURGERY ONLY TAKE THESE MEDICATIONS WITH SIPS OF WATER :    Use inhalers on the day of surgery and bring to the hospital.  Fleets enema or bowel prep as directed.  No Alcohol for 24 hours before or after surgery.  No Smoking including e-cigarettes for 24 hours before surgery.  No chewable tobacco products for at least 6 hours before surgery.  No nicotine patches on the day of surgery.  Do not use any recreational drugs for at least a week (preferably 2 weeks) before your surgery.  Please be advised that the combination of cocaine and anesthesia may have negative outcomes, up to and including death. If you test positive for cocaine, your surgery will be cancelled.  On the morning of surgery brush your teeth with toothpaste and water , you may rinse your mouth with mouthwash if you wish. Do not swallow any toothpaste or mouthwash.  Use CHG Soap or wipes as directed on instruction sheet.  Do not wear jewelry, make-up, hairpins, clips or nail polish.  For welded (permanent) jewelry: bracelets, anklets, waist bands, etc.  Please have this removed prior to surgery.  If it is not removed, there is a chance that hospital personnel will need to cut it off on the day of surgery.  Do not wear lotions, powders, or perfumes.   Do not shave body hair from the neck down 48 hours before surgery.  Contact lenses, hearing aids and dentures may not be worn into surgery.  Do not bring valuables to the hospital. [facility]  is not responsible for any missing/lost belongings or valuables.   Total Shoulder Arthroplasty:  use Benzoyl Peroxide 5% Gel as directed on instruction sheet.  Bring your C-PAP to the hospital in case you may have to spend the night.   Notify your doctor if there is any change in your medical condition (cold, fever, infection).  Wear comfortable clothing (specific to your surgery type) to the hospital.  After surgery, you can help  prevent lung complications by doing breathing exercises.  Take deep breaths and cough every 1-2 hours. Your doctor may order a device called an Incentive Spirometer to help you take deep breaths. When coughing or sneezing, hold a pillow firmly against your incision with both hands. This is called "splinting." Doing this helps protect your incision. It also decreases belly discomfort.  If you are being admitted to the hospital overnight, leave your suitcase in the car. After surgery it may be brought to your room.  In case of increased patient census, it may be necessary for you, the patient, to continue your postoperative care in the Same Day Surgery department.  If you are being discharged the day of surgery, you will not be allowed to drive home. You will need a responsible individual to drive you home and stay with you for 24 hours after surgery.   If you are taking public transportation, you will need to have a responsible individual with you.  Please call the Pre-admissions Testing Dept. at [phone] if you have any questions about these instructions.  Surgery Visitation Policy:  Patients having surgery or a procedure may have two visitors.  Children under the age of 34 must have an adult with them who is not the patient.  Inpatient Visitation:    Visiting hours are 7 a.m. to 8 p.m. Up to four visitors are allowed at one time in a patient room. The visitors may rotate out with other people during the day.  One visitor age 6 or older may stay with the patient overnight and must be in the room by 8 p.m.   Merchandiser, retail to address health-related social needs:  [email or web address]     Preparing for Surgery with CHLORHEXIDINE GLUCONATE (CHG) Soap  Chlorhexidine Gluconate (CHG) Soap  o An antiseptic cleaner that kills germs and bonds with the skin to continue killing germs even after washing  o Used for showering the night before surgery and  morning of surgery  Before surgery, you can play an important role by reducing the number of germs on your skin.  CHG (Chlorhexidine gluconate) soap is an antiseptic cleanser which kills germs and bonds with the skin to continue killing germs even after washing.  Please do not use if you have an allergy to CHG or antibacterial soaps. If your skin becomes reddened/irritated stop using the CHG.  1. Shower the NIGHT BEFORE SURGERY and the MORNING OF SURGERY with CHG soap.  2. If you choose to wash your hair, wash your hair first as usual with your normal shampoo.  3. After shampooing, rinse your hair and body thoroughly to remove the shampoo.  4. Use CHG as you would any other liquid soap. You can apply CHG directly to the skin and wash gently with a scrungie or a clean washcloth.  5. Apply the CHG soap to your body only from the neck down. Do not use on open wounds or open sores. Avoid contact with your eyes, ears, mouth, and genitals (private parts). Wash face and genitals (private  parts) with your normal soap.  6. Wash thoroughly, paying special attention to the area where your surgery will be performed.  7. Thoroughly rinse your body with warm water .  8. Do not shower/wash with your normal soap after using and rinsing off the CHG soap.  9. Pat yourself dry with a clean towel.  10. Wear clean pajamas to bed the night before surgery.  12. Place clean sheets on your bed the night of your first shower and do not sleep with pets.  13. Shower again with the CHG soap on the day of surgery prior to arriving at the hospital.  14. Do not apply any deodorants/lotions/powders.  15. Please wear clean clothes to the hospital.

## 2024-04-19 NOTE — Op Note (Signed)
 Cesarean Section Operative Note    Patient Name: Sarah Harvey  Date of Birth: 1995/11/19  MRN: [id number]  Date of Surgery: 04/19/2024   Pre-operative Diagnosis:  1) Elective cesarean delivery, history of POTS 2) Gestational hypertension 3) intrauterine pregnancy at [redacted]w[redacted]d   Post-operative Diagnosis:  1) Elective cesarean delivery, history of POTS 2) Gestational hypertension 3) intrauterine pregnancy at [redacted]w[redacted]d    Procedure: Primary Low Transverse Cesarean Section via Pfannenstiel incision with double layer uterine closure  Surgeon: Surgeons and Role:    DEWAINE Leonce Sarah JONETTA, MD - Primary   Assistants: Therisa Pillow, CNM; No other capable assistant available, in surgery requiring high level assistant.  Anesthesia: spinal   Findings:  1) normal appearing gravid uterus, fallopian tubes, and ovaries 2) viable female infant weight APGARs 9 and 9, weight 3,170 grams   Quantified Blood Loss: 565 mL  Total IV Fluids: 800 ml   Urine Output: 30 mL  Specimens: none  Complications: no complications  Disposition: PACU - hemodynamically stable.   Maternal Condition: stable   Baby condition / location:  Couplet care / Skin to Skin  Procedure Details:  The patient was seen in the Holding Room. The risks, benefits, complications, treatment options, and expected outcomes were discussed with the patient. The patient concurred with the proposed plan, giving informed consent. identified as Sarah Harvey and the procedure verified as C-Section Delivery. A Time Out was held and the above information confirmed.   After induction of anesthesia, the patient was draped and prepped in the usual sterile manner. A Traxxi was utilized to help with retraction.  A Pfannenstiel incision was made and carried down through the subcutaneous tissue to the fascia. Fascial incision was made and extended transversely. The fascia was separated from the underlying rectus tissue superiorly and inferiorly.  The peritoneum was identified and entered. Peritoneal incision was extended longitudinally. An Alexis retractor was placed, ensuring no bowel or omentum trapped under the ring.  The bladder flap was not bluntly or sharply freed from the lower uterine segment. A low transverse uterine incision was made and the hysterotomy was extended with cranial-caudal tension. Delivered from cephalic presentation was a 3,170 gram Living newborn infant(s) or Female with Apgar scores of 9 at one minute and 9 at five minutes. Cord ph was not sent the umbilical cord was clamped and cut cord blood was not obtained for evaluation. The placenta was removed Intact and appeared normal. The uterine outline appeared normal. The uterine incision was closed with the uterus in-situ with running locked sutures of 0 Vicryl.  A second layer of the same suture was thrown in an imbricating fashion.  Hemostasis was assured.  The rectus muscles were inspected and found to be hemostatic.  The On-Q catheter pumps were inserted in accordance with the manufacturer's recommendations.  The catheters were inserted approximately 4 cm cephelad to the incision line, approximately 1 cm apart, straddling the midline.  They were inserted to a depth of the 4th mark. They were positioned superficial to the rectus abdominus muscles and deep to the rectus fascia.    The fascia was then reapproximated with running sutures of 1-0 PDS, looped. The subcutaneous tissue was reapproximated using 2-0 plain gut such that no greater than 2cm of dead space remained. The subcuticular closure was performed using 4-0 monocryl. The skin closure was reinforced using surgical skin glue.  The On-Q catheters were bolused with 5 mL of 0.5% marcaine  plain for a total of 10 mL.  The  catheters were affixed to the skin with surgical skin glue, steri-strips, and tegaderm.    The surgical assistant performed tissue retraction, assistance with suturing, and fundal pressure.  Instrument,  sponge, and needle counts were correct prior the abdominal closure and were correct at the conclusion of the case.  The patient received Ancef  3 gram IV and azithromycin  500 mg IV prior to skin incision (within 30 minutes). For VTE prophylaxis she was wearing SCDs throughout the case.  The assistant surgeon was an CNM due to lack of availability of another Sales promotion account executive.    Signed: Garnette CHARM Mace, MD 04/19/2024 6:51 PM

## 2024-04-19 NOTE — Discharge Summary (Addendum)
 Postpartum Discharge Summary  Patient Name: Sarah Harvey DOB: 28-Aug-1996 MRN: [id number]  Date of admission: 04/19/2024 Delivery date:04/19/2024 Delivering provider: JACKSON, STEPHEN D Date of discharge: 04/21/2024  Primary OB: [facility] LMP:No LMP recorded. EDC Estimated Date of Delivery: 04/26/24 Gestational Age at Delivery: [redacted]w[redacted]d   Admitting diagnosis: Decreased fetal movement affecting management of pregnancy in third trimester [O36.8130] Delivery by elective cesarean section [O82] Intrauterine pregnancy: [redacted]w[redacted]d     Secondary diagnosis:   Principal Problem:   Gestational hypertension Active Problems:   POTS (postural orthostatic tachycardia syndrome)   Delivery by elective cesarean section   Anxiety in pregnancy, antepartum   Obesity in pregnancy, antepartum   [redacted] weeks gestation of pregnancy   Discharge Diagnosis: Term Pregnancy Delivered and Gestational Hypertension      Hospital course: Sceduled C/S   28 y.o. yo G1P1001 at [redacted]w[redacted]d was admitted to the hospital 04/19/2024 for scheduled cesarean section with the following indication:Elective Primary and gestational hypertension.Delivery details are as follows:  Membrane Rupture Time/Date: 5:49 PM,04/19/2024  Delivery Method:C-Section, Low Transverse Operative Delivery:N/A Details of operation can be found in separate operative note.  Patient had a postpartum course complicated by mildly elevated BP. She was started on Procardia  xl 30 mg daily, with a 2 day outpatient follow up.  She is ambulating, tolerating a regular diet, passing flatus, and urinating well. Patient is discharged home in stable condition on  04/21/24        Newborn Data: Birth date:04/19/2024 Birth time:5:50 PM Gender:Female Living status:Living Apgars:9 ,9  Weight:3170 g                                              Post partum procedures:none Induction:: N/A Complications: None Delivery Type: primary cesarean section, low transverse  incision Anesthesia: spinal anesthesia Placenta: manual removal To Pathology: No   Prenatal Labs:  Blood type/Rh A POS   Antibody screen neg  Rubella Immune (01/29 0000)   Varicella Immune  RPR NR  HBsAg Neg  Hep C NR  HIV NR  GC neg  Chlamydia neg  Genetic screening cfDNA negative  1 hour GTT 141  3 hour GTT F82, 124, 146, 81   GBS Negative/-- (07/24 0000)     Magnesium Sulfate received: No BMZ received: No Rhophylac:was not indicated MMR: was not indicated Varivax vaccine given: was not indicated - Tdap vaccine: Given prenatally - Flu vaccine: Given prenatally - RSV vaccine: Not in season   Transfusion:No  Physical exam  Vitals:   04/20/24 1923 04/20/24 2319 04/21/24 0543 04/21/24 0726  BP: 122/88 (!) 129/91 117/73 (!) 112/90  Pulse: 92 94 95 94  Resp: 18 18 18 18   Temp: (!) 97.4 F (36.3 C) 98 F (36.7 C) 98.6 F (37 C) 98.3 F (36.8 C)  TempSrc: Oral Oral Oral Oral  SpO2: 100% 99% 100% 99%  Weight:      Height:       General: alert, cooperative, and no distress Lochia: appropriate Uterine Fundus: firm Perineum: minimal edema/intact Incision: Healing well with no significant drainage, No significant erythema, Dressing is clean, dry, and intact, covered with occlusive OP site dressing  DVT Evaluation: No evidence of DVT seen on physical exam. Negative Homan's sign. No cords or calf tenderness. No significant calf/ankle edema.  Labs: Lab Results  Component Value Date   WBC 13.5 (H) 04/20/2024  HGB 10.3 (L) 04/20/2024   HCT 31.0 (L) 04/20/2024   MCV 87.3 04/20/2024   PLT 394 04/20/2024      Latest Ref Rng & Units 04/19/2024    9:51 AM  CMP  Glucose 70 - 99 mg/dL 869   BUN 6 - 20 mg/dL 10   Creatinine 9.55 - 1.00 mg/dL 9.26   Sodium 864 - 854 mmol/L 137   Potassium 3.5 - 5.1 mmol/L 3.8   Chloride 98 - 111 mmol/L 106   CO2 22 - 32 mmol/L 15   Calcium  8.9 - 10.3 mg/dL 8.9   Total Protein 6.5 - 8.1 g/dL 6.5   Total Bilirubin 0.0 - 1.2 mg/dL  0.4   Alkaline Phos 38 - 126 U/L 169   AST 15 - 41 U/L 26   ALT 0 - 44 U/L 11    Edinburgh Score:    04/20/2024    1:53 PM  Edinburgh Postnatal Depression Scale Screening Tool  I have been able to laugh and see the funny side of things. 0  I have looked forward with enjoyment to things. 0  I have blamed myself unnecessarily when things went wrong. 2  I have been anxious or worried for no good reason. 2  I have felt scared or panicky for no good reason. 2  Things have been getting on top of me. 1  I have been so unhappy that I have had difficulty sleeping. 0  I have felt sad or miserable. 0  I have been so unhappy that I have been crying. 0  The thought of harming myself has occurred to me. 0  Edinburgh Postnatal Depression Scale Total 7   Postpartum VTE prophylaxis  Recommend 6 weeks of prophylactic anticoagulation with LMWH or subcutaneous unfractionated heparin if 1 or more high risk factor is present.  Recommend 14 days of prophylactic anticoagulation with LMWH or subcutaneous unfractionated heparin if 3 or more moderate risk factors are present.   Risk assessment for postpartum VTE and prophylactic treatment: High risk factors: None Moderate risk factors: Cesarean delivery  and BMI 40-60 kg/m2  Postpartum VTE prophylaxis with LMWH not indicated    After visit meds:  Allergies as of 04/21/2024       Reactions   Sulfa Antibiotics Hives   Other reaction(s): Unknown        Medication List     STOP taking these medications    aspirin EC 81 MG tablet   metFORMIN  1000 MG tablet Commonly known as: GLUCOPHAGE    omeprazole 20 MG capsule Commonly known as: PRILOSEC   ondansetron  4 MG tablet Commonly known as: ZOFRAN        TAKE these medications    acetaminophen  500 MG tablet Commonly known as: TYLENOL  Take 2 tablets (1,000 mg total) by mouth every 8 (eight) hours as needed for mild pain (pain score 1-3).   Blood Pressure Kit 1 Units by Does not apply route 2  (two) times daily.   coconut oil Oil Apply 1 Application topically as needed.   dibucaine 1 % Oint Commonly known as: NUPERCAINAL Place 1 Application rectally as needed for hemorrhoids.   ferrous sulfate  325 (65 FE) MG tablet Take 1 tablet (325 mg total) by mouth daily with breakfast.   ibuprofen  600 MG tablet Commonly known as: ADVIL  Take 1 tablet (600 mg total) by mouth every 6 (six) hours.   NIFEdipine  30 MG 24 hr tablet Commonly known as: ADALAT  CC Take 1 tablet (30 mg total) by mouth  daily. Start taking on: April 22, 2024   oxyCODONE  5 MG immediate release tablet Commonly known as: Oxy IR/ROXICODONE  Take 1 tablet (5 mg total) by mouth every 6 (six) hours as needed for severe pain (pain score 7-10).   PRENATAL GUMMIES PO Take 1 tablet by mouth at bedtime.   senna-docusate 8.6-50 MG tablet Commonly known as: Senokot-S Take 2 tablets by mouth daily.   sertraline  50 MG tablet Commonly known as: ZOLOFT  Take 50 mg by mouth in the morning.   simethicone  80 MG chewable tablet Commonly known as: MYLICON Chew 1 tablet (80 mg total) by mouth as needed for flatulence.   witch hazel-glycerin  pad Commonly known as: TUCKS Apply 1 Application topically as needed for hemorrhoids.       Discharge home in stable condition Infant Feeding: Bottle Infant Disposition:home with mother Discharge instruction: per After Visit Summary and Postpartum booklet. Activity: Advance as tolerated. Pelvic rest for 6 weeks.  Diet: routine diet Anticipated Birth Control:  Contraceptives: Vasectomy Postpartum Appointment:6 weeks Additional Postpartum F/U: Postpartum Depression checkup, Incision check 1 week, and 2 weeks mood check Future Appointments:No future appointments. Follow up Visit:  Follow-up Information     Leonce Garnette BIRCH, MD. Go on 04/30/2024.   Specialty: Obstetrics and Gynecology Why: For incision check, (Keep previously scheduled appointment) Contact information: [address] [place] [address] [phone]         [facility] Follow up in 2 day(s).   Why: BP check Contact information: [address] [place]  [address] [phone]        [facility] Follow up in 2 week(s).   Why: mood check Contact information: [address] [place]  [address] [phone]                Plan:  Marcelle Hepner was discharged to home in good condition. Follow-up appointment as directed.    Signed: Bobbette Brunswick CNM

## 2024-04-19 NOTE — Transfer of Care (Signed)
 Immediate Anesthesia Transfer of Care Note  Patient: Sarah Harvey  Procedure(s) Performed: CESAREAN DELIVERY  Patient Location: PACU  Anesthesia Type:Spinal  Level of Consciousness: awake, alert , and oriented  Airway & Oxygen Therapy: Patient Spontanous Breathing  Post-op Assessment: Report given to RN and Post -op Vital signs reviewed and stable  Post vital signs: Reviewed and stable  Last Vitals:  Vitals Value Taken Time  BP    Temp    Pulse    Resp    SpO2      Last Pain:  Vitals:   04/19/24 1626  TempSrc:   PainSc: 4          Complications: No notable events documented.

## 2024-04-20 LAB — RPR: RPR Ser Ql: NONREACTIVE

## 2024-04-20 LAB — CBC
HCT: 31 % — ABNORMAL LOW (ref 36.0–46.0)
Hemoglobin: 10.3 g/dL — ABNORMAL LOW (ref 12.0–15.0)
MCH: 29 pg (ref 26.0–34.0)
MCHC: 33.2 g/dL (ref 30.0–36.0)
MCV: 87.3 fL (ref 80.0–100.0)
Platelets: 394 K/uL (ref 150–400)
RBC: 3.55 MIL/uL — ABNORMAL LOW (ref 3.87–5.11)
RDW: 13.6 % (ref 11.5–15.5)
WBC: 13.5 K/uL — ABNORMAL HIGH (ref 4.0–10.5)
nRBC: 0 % (ref 0.0–0.2)

## 2024-04-20 MED ORDER — ACETAMINOPHEN 500 MG PO TABS
1000.0000 mg | ORAL_TABLET | Freq: Three times a day (TID) | ORAL | Status: DC | PRN
  Administered 2024-04-20 – 2024-04-21 (×2): 1000 mg via ORAL
  Filled 2024-04-20 (×2): qty 2

## 2024-04-20 MED ORDER — ACETAMINOPHEN 500 MG PO TABS: 1000.0000 mg | ORAL_TABLET | Freq: Four times a day (QID) | ORAL | Status: DC | PRN

## 2024-04-20 NOTE — Discharge Instructions (Signed)
 SABRA

## 2024-04-20 NOTE — Lactation Note (Signed)
 This note was copied from a baby's chart. Lactation Consultation Note  Patient Name: Sarah Harvey Date: 04/20/2024 Age:29 hours Reason for consult: Initial assessment;Primapara;Term   Maternal Data Has patient been taught Hand Expression?: Yes Does the patient have breastfeeding experience prior to this delivery?: No  Feeding Mother's Current Feeding Choice: Breast Milk Observation of breastfeeding, baby awakened, mom reports easy latch on right, more difficult on left, attempted on left after hand expression, in cross cradle position and football, this breast more firm around areola, , baby has difficulty getting deep latch unless breast shaped at areola, easily lets larger breast fall out of mouth unless supported, nursed off and on x 10 min.  mom easily latches baby to right breast, she feels more at ease with this side, baby nursed 15 min this breast with swallows noted, mom given manual Harmony pump to prepump before latch to soften areola and lengthen nipple, encouraged to try different positions, demonstrated use of pump.  Mom encouraged that she is doing well and baby is only 71 hrs old.       ATCH Score Latch: Grasps breast easily, tongue down, lips flanged, rhythmical sucking. (right breast)  Audible Swallowing: Spontaneous and intermittent (right breast)  Type of Nipple: Everted at rest and after stimulation  Comfort (Breast/Nipple): Filling, red/small blisters or bruises, mild/mod discomfort (left breast firmer)  Hold (Positioning): Assistance needed to correctly position infant at breast and maintain latch.  LATCH Score: 8   Lactation Tools Discussed/Used Tools: Pump Breast pump type: Manual Pump Education: Setup, frequency, and cleaning Reason for Pumping: to soften areola area on left breast and elongate nipple for easier latch Pumping frequency: pre pump  Interventions Interventions: Breast feeding basics reviewed;Assisted with latch;Skin to skin;Hand  express;Pre-pump if needed;Breast compression;Adjust position;Support pillows;Coconut oil;Hand pump;Education  Discharge Pump: Manual;[facility] Program: No  Consult Status Consult Status: Follow-up Date: 04/21/24 Follow-up type: In-patient    Sarah Harvey 04/20/2024, 7:19 PM

## 2024-04-20 NOTE — Progress Notes (Signed)
 Post Partum Day 1  Subjective: Doing well, no concerns. Ambulating without difficulty, pain managed with PO meds, tolerating regular diet, and voiding without difficulty.   No fever/chills, chest pain, shortness of breath, nausea/vomiting, or leg pain. No nipple or breast pain. No headache, visual changes, or RUQ/epigastric pain.  Objective: BP 119/86 (BP Location: Left Arm)   Pulse (!) 123   Temp 98.2 F (36.8 C) (Oral)   Resp 18   Ht 5' 4 (1.626 m)   Wt 122 kg   SpO2 95%   Breastfeeding Unknown   BMI 46.17 kg/m    Physical Exam:  General: alert and cooperative Breasts: soft/nontender CV: RRR Pulm: nl effort Abdomen: soft, non-tender Uterine Fundus: firm Incision: healing well Perineum: n/a Lochia: appropriate DVT Evaluation: No evidence of DVT seen on physical exam. Edinburgh:      No data to display           Recent Labs    04/19/24 1520 04/20/24 0622  HGB 12.9 10.3*  HCT 38.0 31.0*  WBC 10.9* 13.5*  PLT 473* 394    Assessment/Plan: 28 y.o. G1P1001 postpartum day # 1  1. Continue routine postpartum care  2. Infant feeding status: breast feeding -Lactation consult PRN for breastfeeding   3. Contraception plan: vasectomy  4. Acute blood loss anemia - clinically not significant .  -Hemodynamically stable and asymptomatic -Intervention: continue on oral supplementation with ferrous sulfate  325  5. Immunization status:   all immunizations up to date  6. GHTN 04/19/24     PCR 250;  PLT 473;   LFTs WNL Vitals:   04/19/24 1550 04/19/24 1900 04/19/24 1915 04/19/24 1930  BP: (!) 148/96 109/71 (!) 88/57 (!) 123/107   04/19/24 1945 04/19/24 2030 04/19/24 2045 04/19/24 2100  BP: (!) 82/62 (!) 143/84 102/81 127/83   04/19/24 2250 04/19/24 2350 04/20/24 0500 04/20/24 0843  BP: (!) 144/85 123/83 112/83 119/86     Disposition: Continue inpatient postpartum care    LOS: 1 day   Sarah Harvey, CNM 04/20/2024, 9:16 AM

## 2024-04-20 NOTE — Anesthesia Postprocedure Evaluation (Signed)
 Anesthesia Post Note  Patient: Sarah Harvey  Procedure(s) Performed: CESAREAN DELIVERY  Patient location during evaluation: Mother Baby Anesthesia Type: Spinal Level of consciousness: oriented and awake and alert Pain management: pain level controlled Vital Signs Assessment: post-procedure vital signs reviewed and stable Respiratory status: spontaneous breathing and respiratory function stable Cardiovascular status: blood pressure returned to baseline and stable Postop Assessment: no headache, no backache, no apparent nausea or vomiting and able to ambulate Anesthetic complications: no   No notable events documented.   Last Vitals:  Vitals:   04/20/24 0500 04/20/24 0843  BP: 112/83 119/86  Pulse:    Resp: 18 18  Temp: 37.3 C 36.8 C  SpO2:      Last Pain:  Vitals:   04/20/24 0926  TempSrc:   PainSc: 5                  Lendia LITTIE Mae

## 2024-04-21 MED ORDER — ACETAMINOPHEN 500 MG PO TABS: 1000.0000 mg | ORAL_TABLET | Freq: Three times a day (TID) | ORAL | Status: DC | PRN

## 2024-04-21 MED ORDER — IBUPROFEN 600 MG PO TABS: 600.0000 mg | ORAL_TABLET | Freq: Four times a day (QID) | ORAL | 0 refills | Status: DC

## 2024-04-21 MED ORDER — FERROUS SULFATE 325 (65 FE) MG PO TABS: 325.0000 mg | ORAL_TABLET | Freq: Every day | ORAL | Status: DC

## 2024-04-21 MED ORDER — NIFEDIPINE ER 30 MG PO TB24: 30.0000 mg | ORAL_TABLET | Freq: Every day | ORAL | 0 refills | Status: DC

## 2024-04-21 MED ORDER — NIFEDIPINE ER OSMOTIC RELEASE 30 MG PO TB24
30.0000 mg | ORAL_TABLET | Freq: Every day | ORAL | Status: DC
  Administered 2024-04-21: 30 mg via ORAL
  Filled 2024-04-21: qty 1

## 2024-04-21 MED ORDER — DIBUCAINE (PERIANAL) 1 % EX OINT: 1.0000 | TOPICAL_OINTMENT | CUTANEOUS | Status: DC | PRN

## 2024-04-21 MED ORDER — OXYCODONE HCL 5 MG PO TABS: 5.0000 mg | ORAL_TABLET | Freq: Four times a day (QID) | ORAL | 0 refills | Status: DC | PRN

## 2024-04-21 MED ORDER — WITCH HAZEL-GLYCERIN EX PADS: 1.0000 | MEDICATED_PAD | CUTANEOUS | Status: DC | PRN

## 2024-04-21 MED ORDER — COCONUT OIL OIL: 1.0000 | TOPICAL_OIL | Status: DC | PRN

## 2024-04-21 MED ORDER — SENNOSIDES-DOCUSATE SODIUM 8.6-50 MG PO TABS: 2.0000 | ORAL_TABLET | ORAL | Status: DC

## 2024-04-21 MED ORDER — BLOOD PRESSURE KIT: 1.0000 [IU] | PACK | Freq: Two times a day (BID) | 0 refills | Status: AC

## 2024-04-21 MED ORDER — SIMETHICONE 80 MG PO CHEW: 80.0000 mg | CHEWABLE_TABLET | ORAL | Status: DC | PRN

## 2024-04-21 NOTE — Progress Notes (Signed)
 Discharge instructions reviewed with patient and significant other.  Questions answered and follow up care reviewed.  Printed copies given to patient for reference after discharge home.

## 2024-04-21 NOTE — Plan of Care (Signed)
  Problem: Education: Goal: Knowledge of disease or condition will improve Outcome: Progressing Goal: Knowledge of the prescribed therapeutic regimen will improve Outcome: Progressing Goal: Individualized Educational Video(s) Outcome: Progressing   Problem: Clinical Measurements: Goal: Complications related to the disease process, condition or treatment will be avoided or minimized Outcome: Progressing   Problem: Education: Goal: Knowledge of General Education information will improve Description: Including pain rating scale, medication(s)/side effects and non-pharmacologic comfort measures Outcome: Progressing   Problem: Health Behavior/Discharge Planning: Goal: Ability to manage health-related needs will improve Outcome: Progressing   Problem: Clinical Measurements: Goal: Ability to maintain clinical measurements within normal limits will improve Outcome: Progressing Goal: Will remain free from infection Outcome: Progressing Goal: Diagnostic test results will improve Outcome: Progressing Goal: Respiratory complications will improve Outcome: Progressing Goal: Cardiovascular complication will be avoided Outcome: Progressing   Problem: Activity: Goal: Risk for activity intolerance will decrease Outcome: Progressing   Problem: Nutrition: Goal: Adequate nutrition will be maintained Outcome: Progressing   Problem: Coping: Goal: Level of anxiety will decrease Outcome: Progressing   Problem: Elimination: Goal: Will not experience complications related to bowel motility Outcome: Progressing Goal: Will not experience complications related to urinary retention Outcome: Progressing   Problem: Pain Managment: Goal: General experience of comfort will improve and/or be controlled Outcome: Progressing   Problem: Safety: Goal: Ability to remain free from injury will improve Outcome: Progressing   Problem: Skin Integrity: Goal: Risk for impaired skin integrity will  decrease Outcome: Progressing   Problem: Education: Goal: Knowledge of the prescribed therapeutic regimen will improve Outcome: Progressing Goal: Understanding of sexual limitations or changes related to disease process or condition will improve Outcome: Progressing Goal: Individualized Educational Video(s) Outcome: Progressing   Problem: Self-Concept: Goal: Communication of feelings regarding changes in body function or appearance will improve Outcome: Progressing   Problem: Skin Integrity: Goal: Demonstration of wound healing without infection will improve Outcome: Progressing   Problem: Education: Goal: Knowledge of condition will improve Outcome: Progressing Goal: Individualized Educational Video(s) Outcome: Progressing Goal: Individualized Newborn Educational Video(s) Outcome: Progressing   Problem: Activity: Goal: Will verbalize the importance of balancing activity with adequate rest periods Outcome: Progressing Goal: Ability to tolerate increased activity will improve Outcome: Progressing   Problem: Coping: Goal: Ability to identify and utilize available resources and services will improve Outcome: Progressing   Problem: Life Cycle: Goal: Chance of risk for complications during the postpartum period will decrease Outcome: Progressing   Problem: Role Relationship: Goal: Ability to demonstrate positive interaction with newborn will improve Outcome: Progressing   Problem: Skin Integrity: Goal: Demonstration of wound healing without infection will improve Outcome: Progressing

## 2024-04-22 ENCOUNTER — Encounter: Payer: Self-pay | Admitting: Obstetrics and Gynecology

## 2024-04-23 ENCOUNTER — Inpatient Hospital Stay: Admission: RE | Admit: 2024-04-23 | Source: Home / Self Care | Admitting: Obstetrics and Gynecology

## 2024-04-30 ENCOUNTER — Other Ambulatory Visit: Payer: Self-pay

## 2024-04-30 ENCOUNTER — Emergency Department

## 2024-04-30 ENCOUNTER — Emergency Department
Admission: EM | Admit: 2024-04-30 | Discharge: 2024-04-30 | Disposition: A | Discharge status: Home / Self Care | Attending: Emergency Medicine | Admitting: Emergency Medicine

## 2024-04-30 DIAGNOSIS — O9963 Diseases of the digestive system complicating the puerperium: Secondary | ICD-10-CM | POA: Diagnosis not present

## 2024-04-30 DIAGNOSIS — K529 Noninfective gastroenteritis and colitis, unspecified: Secondary | ICD-10-CM | POA: Diagnosis not present

## 2024-04-30 DIAGNOSIS — R000 Tachycardia, unspecified: Secondary | ICD-10-CM | POA: Diagnosis not present

## 2024-04-30 DIAGNOSIS — O9089 Other complications of the puerperium, not elsewhere classified: Secondary | ICD-10-CM | POA: Diagnosis present

## 2024-04-30 LAB — COMPREHENSIVE METABOLIC PANEL WITH GFR
ALT: 22 U/L (ref 0–44)
AST: 23 U/L (ref 15–41)
Albumin: 3.1 g/dL — ABNORMAL LOW (ref 3.5–5.0)
Alkaline Phosphatase: 128 U/L — ABNORMAL HIGH (ref 38–126)
Anion gap: 10 (ref 5–15)
BUN: 13 mg/dL (ref 6–20)
CO2: 21 mmol/L — ABNORMAL LOW (ref 22–32)
Calcium: 8.9 mg/dL (ref 8.9–10.3)
Chloride: 108 mmol/L (ref 98–111)
Creatinine, Ser: 0.75 mg/dL (ref 0.44–1.00)
GFR, Estimated: >60 mL/min (ref >60)
Glucose, Bld: 95 mg/dL (ref 70–99)
Potassium: 4.1 mmol/L (ref 3.5–5.1)
Sodium: 139 mmol/L (ref 135–145)
Total Bilirubin: 0.3 mg/dL (ref 0.0–1.2)
Total Protein: 6.9 g/dL (ref 6.5–8.1)

## 2024-04-30 LAB — CBC
HCT: 34.1 % — ABNORMAL LOW (ref 36.0–46.0)
Hemoglobin: 11.4 g/dL — ABNORMAL LOW (ref 12.0–15.0)
MCH: 29.4 pg (ref 26.0–34.0)
MCHC: 33.4 g/dL (ref 30.0–36.0)
MCV: 87.9 fL (ref 80.0–100.0)
Platelets: 486 K/uL — ABNORMAL HIGH (ref 150–400)
RBC: 3.88 MIL/uL (ref 3.87–5.11)
RDW: 13.9 % (ref 11.5–15.5)
WBC: 11.4 K/uL — ABNORMAL HIGH (ref 4.0–10.5)
nRBC: 0 % (ref 0.0–0.2)

## 2024-04-30 LAB — URINALYSIS, ROUTINE W REFLEX MICROSCOPIC
Bilirubin Urine: NEGATIVE
Glucose, UA: NEGATIVE mg/dL
Ketones, ur: NEGATIVE mg/dL
Nitrite: NEGATIVE
Protein, ur: NEGATIVE mg/dL
RBC / HPF: 0 RBC/hpf (ref 0–5)
Specific Gravity, Urine: 1.006 (ref 1.005–1.030)
pH: 7 (ref 5.0–8.0)

## 2024-04-30 LAB — LIPASE, BLOOD: Lipase: 31 U/L (ref 11–51)

## 2024-04-30 LAB — PREGNANCY, URINE: Preg Test, Ur: NEGATIVE

## 2024-04-30 LAB — LACTIC ACID, PLASMA: Lactic Acid, Venous: 1.5 mmol/L (ref 0.5–1.9)

## 2024-04-30 MED ORDER — SODIUM CHLORIDE 0.9 % IV BOLUS
1000.0000 mL | Freq: Once | INTRAVENOUS | Status: AC
  Administered 2024-04-30: 1000 mL via INTRAVENOUS

## 2024-04-30 MED ORDER — ONDANSETRON HCL 4 MG/2ML IJ SOLN
4.0000 mg | Freq: Once | INTRAMUSCULAR | Status: AC
  Administered 2024-04-30: 4 mg via INTRAVENOUS
  Filled 2024-04-30: qty 2

## 2024-04-30 NOTE — ED Triage Notes (Signed)
 Pt comes in via pov with complaints on n/v/d that started about 9pm last night. Pt states that she recently gave birth via c-section on 04/19/24. Pt took ibuprofen  at 730 this morning. Pt complains of abdominal discomfort 4/10. Pt has no complaints of pain at incision site. Pt's last episode of vomiting and diarrhea was around 6am this morning.

## 2024-04-30 NOTE — Discharge Instructions (Addendum)
 I suspect this is more likely related to gastroenteritis or a viral infection.  She should return to the ER if you develop fevers over 100.4, change in the smell of your vaginal discharge, abdominal pain or any other concerns.

## 2024-04-30 NOTE — ED Provider Notes (Addendum)
 [facility] Provider Note    Event Date/Time   First MD Initiated Contact with Patient 04/30/24 [phone]     (approximate)   History   Emesis   HPI  Sarah Harvey is a 28 y.o. female with recent C-section who comes in with nausea vomiting diarrhea that started last night.  She does report taking ibuprofen  this morning prior to coming in.    I reviewed the notes where on 8/8 patient had a C-section delivery.  When she followed up with her OB/GYN team on 8/12 and her blood pressure was stable.  I reviewed the discharge summary from 8/8.  Patient had a C-section that was elective primary and gestational hypertension.  She was on Procardia  XL 30 daily and was discharged on 04/21/2024  Patient reports that she has had a little bit of light spotting but denies any vaginal discharge or significant bleeding.  She denies any foul smell to her discharge.  She reports that her temperatures have been low-grade with the highest of being on 100.0.  She states that nobody else has been sick at home.  She denies any abdominal pain other than in the right upper quadrant but she states that pain is more just when she needs to have a bowel movement.  She does not really have any pain currently.  She denies any chest pain, shortness of breath.  She does report multiple episodes of nausea vomiting diarrhea.  She does report that the started last night around 9 PM.  She denies anyone else being sick.   Physical Exam   Triage Vital Signs: ED Triage Vitals [04/30/24 0825]  Encounter Vitals Group     BP (!) 120/90     Girls Systolic BP Percentile      Girls Diastolic BP Percentile      Boys Systolic BP Percentile      Boys Diastolic BP Percentile      Pulse Rate (!) 105     Resp 19     Temp 99.2 F (37.3 C)     Temp src      SpO2 100 %     Weight 268 lb 15.4 oz (122 kg)     Height 5' 4 (1.626 m)     Head Circumference      Peak Flow      Pain Score 4     Pain Loc       Pain Education      Exclude from Growth Chart     Most recent vital signs: Vitals:   04/30/24 0825  BP: (!) 120/90  Pulse: (!) 105  Resp: 19  Temp: 99.2 F (37.3 C)  SpO2: 100%     General: Awake, no distress.  CV:  Good peripheral perfusion.  Tachycardic Resp:  Normal effort.  Abd:  No distention.  Soft nontender Other:  C-section scar is well-healing without any redness, warmth, drainage.  Nontender   ED Results / Procedures / Treatments   Labs (all labs ordered are listed, but only abnormal results are displayed) Labs Reviewed  COMPREHENSIVE METABOLIC PANEL WITH GFR - Abnormal; Notable for the following components:      Result Value   CO2 21 (*)    Albumin 3.1 (*)    Alkaline Phosphatase 128 (*)    All other components within normal limits  CBC - Abnormal; Notable for the following components:   WBC 11.4 (*)    Hemoglobin 11.4 (*)    HCT  34.1 (*)    Platelets 486 (*)    All other components within normal limits  LIPASE, BLOOD  LACTIC ACID, PLASMA  URINALYSIS, ROUTINE W REFLEX MICROSCOPIC  LACTIC ACID, PLASMA  POC URINE PREG, ED     EKG  My interpretation of EKG:  Sinus rhythm 97 without any ST elevation or T wave inversions, normal intervals  RADIOLOGY I have reviewed the us  personally and interpreted no evidence of any gallstones.   PROCEDURES:  Critical Care performed: No  .1-3 Lead EKG Interpretation  Performed by: Ernest Ronal BRAVO, MD Authorized by: Ernest Ronal BRAVO, MD     Interpretation: normal     ECG rate:  80   ECG rate assessment: normal     Rhythm: sinus rhythm     Ectopy: none     Conduction: normal      MEDICATIONS ORDERED IN ED: Medications  sodium chloride  0.9 % bolus 1,000 mL (1,000 mLs Intravenous New Bag/Given 04/30/24 0900)  ondansetron  (ZOFRAN ) injection 4 mg (4 mg Intravenous Given 04/30/24 0852)     IMPRESSION / MDM / ASSESSMENT AND PLAN / ED COURSE  I reviewed the triage vital signs and the nursing  notes.   Patient's presentation is most consistent with acute presentation with potential threat to life or bodily function.   Patient comes in with symptoms that seem consistent with gastroenteritis given recent delivery and she reported some intermittent right upper quadrant pain associate with bowel movements will get an ultrasound just to ensure no gallstones.  Patient is not preeclamptic blood pressures are reassuring and she can remains on her Procardia .  She is slightly tachycardic which I suspect is related to dehydration so she is given a liter of fluid.  I also ordered some Zofran  due to concerns of some nausea.  I popped up that patient has a history of POTS.  However patient reports that she has taken Zofran  without issues previously.  I did obtain an EKG that does show normal QTc.  Patient has a normal lactate.  Lipase is normal CMP is reassuring.  CBC shows slightly elevated white count but downtrending from 10 days ago.  Platelets are slightly elevated but she has had that previously.  I considered the possibility of endometrial-itis but patient denies any foul smelling or change in smell of her lochia, and has no suprapubic discomfort. 10:42 AM Reevaluated patient abdomen soft and nontender patient reports feeling better with medications heart rates have come down.   11:45 AM repeat abdominal soft and nontender.  Patient continues to feel much improved.  She feels comfortable with discharge home. She has Zofran  at home that she can take. Patient reports that she has been doing well denies any SI. Her blood pressures here are also reassuring without any evidence of preeclampsia.  She is already on Procardia .  Her urine without evidence of protein We did discuss doing stool studies here but patient was unable to give a stool sample and had been here for over 3 hours and given resolution of symptoms she preferred to be discharged home  I did alert the OB team [facility] but nothing to  do from their standpoint.   The patient is on the cardiac monitor to evaluate for evidence of arrhythmia and/or significant heart rate changes.      FINAL CLINICAL IMPRESSION(S) / ED DIAGNOSES   Final diagnoses:  Gastroenteritis     Rx / DC Orders   ED Discharge Orders     None  Note:  This document was prepared using Dragon voice recognition software and may include unintentional dictation errors.   Ernest Ronal BRAVO, MD 04/30/24 1151    Ernest Ronal BRAVO, MD 04/30/24 1153

## 2024-05-01 LAB — URINE CULTURE

## 2024-06-29 ENCOUNTER — Telehealth: Admitting: Physician Assistant

## 2024-06-29 DIAGNOSIS — J019 Acute sinusitis, unspecified: Secondary | ICD-10-CM | POA: Diagnosis not present

## 2024-06-29 DIAGNOSIS — B9689 Other specified bacterial agents as the cause of diseases classified elsewhere: Secondary | ICD-10-CM | POA: Diagnosis not present

## 2024-06-29 MED ORDER — AMOXICILLIN-POT CLAVULANATE 875-125 MG PO TABS: 1.0000 | ORAL_TABLET | Freq: Two times a day (BID) | ORAL | 0 refills | Status: DC

## 2024-06-29 NOTE — Progress Notes (Signed)

## 2024-07-04 ENCOUNTER — Encounter: Payer: Self-pay | Admitting: Internal Medicine

## 2024-07-04 ENCOUNTER — Ambulatory Visit: Admitting: Internal Medicine

## 2024-07-04 VITALS — BP 110/70 | HR 76 | Temp 98.1°F | Ht 64.0 in | Wt 270.8 lb

## 2024-07-04 DIAGNOSIS — F439 Reaction to severe stress, unspecified: Secondary | ICD-10-CM | POA: Diagnosis not present

## 2024-07-04 DIAGNOSIS — Z8759 Personal history of other complications of pregnancy, childbirth and the puerperium: Secondary | ICD-10-CM | POA: Diagnosis not present

## 2024-07-04 DIAGNOSIS — D75839 Thrombocytosis, unspecified: Secondary | ICD-10-CM | POA: Diagnosis not present

## 2024-07-04 LAB — BASIC METABOLIC PANEL WITH GFR
BUN: 15 mg/dL (ref 6–23)
CO2: 25 meq/L (ref 19–32)
Calcium: 8.7 mg/dL (ref 8.4–10.5)
Chloride: 105 meq/L (ref 96–112)
Creatinine, Ser: 0.77 mg/dL (ref 0.40–1.20)
GFR: 104.3 mL/min (ref >60.00)
Glucose, Bld: 86 mg/dL (ref 70–99)
Potassium: 4.2 meq/L (ref 3.5–5.1)
Sodium: 140 meq/L (ref 135–145)

## 2024-07-04 LAB — CBC WITH DIFFERENTIAL/PLATELET
Basophils Absolute: 0 K/uL (ref 0.0–0.1)
Basophils Relative: 0.5 % (ref 0.0–3.0)
Eosinophils Absolute: 0.2 K/uL (ref 0.0–0.7)
Eosinophils Relative: 2.9 % (ref 0.0–5.0)
HCT: 38.6 % (ref 36.0–46.0)
Hemoglobin: 12.5 g/dL (ref 12.0–15.0)
Lymphocytes Relative: 24.6 % (ref 12.0–46.0)
Lymphs Abs: 1.9 K/uL (ref 0.7–4.0)
MCHC: 32.5 g/dL (ref 30.0–36.0)
MCV: 86.6 fl (ref 78.0–100.0)
Monocytes Absolute: 0.6 K/uL (ref 0.1–1.0)
Monocytes Relative: 7.3 % (ref 3.0–12.0)
Neutro Abs: 5.1 K/uL (ref 1.4–7.7)
Neutrophils Relative %: 64.7 % (ref 43.0–77.0)
Platelets: 435 K/uL — ABNORMAL HIGH (ref 150.0–400.0)
RBC: 4.45 Mil/uL (ref 3.87–5.11)
RDW: 15.9 % — ABNORMAL HIGH (ref 11.5–15.5)
WBC: 7.9 K/uL (ref 4.0–10.5)

## 2024-07-04 LAB — TSH: TSH: 1.1 u[IU]/mL (ref 0.35–5.50)

## 2024-07-04 LAB — LIPID PANEL
Cholesterol: 153 mg/dL (ref 0–200)
HDL: 56.5 mg/dL (ref >39.00)
LDL Cholesterol: 67 mg/dL (ref 0–99)
NonHDL: 96.66
Total CHOL/HDL Ratio: 3
Triglycerides: 146 mg/dL (ref 0.0–149.0)
VLDL: 29.2 mg/dL (ref 0.0–40.0)

## 2024-07-04 LAB — HEPATIC FUNCTION PANEL
ALT: 12 U/L (ref 0–35)
AST: 11 U/L (ref 0–37)
Albumin: 3.8 g/dL (ref 3.5–5.2)
Alkaline Phosphatase: 122 U/L — ABNORMAL HIGH (ref 39–117)
Bilirubin, Direct: 0 mg/dL (ref 0.0–0.3)
Total Bilirubin: 0.2 mg/dL (ref 0.2–1.2)
Total Protein: 6.7 g/dL (ref 6.0–8.3)

## 2024-07-04 MED ORDER — SERTRALINE HCL 100 MG PO TABS: 100.0000 mg | ORAL_TABLET | Freq: Every day | ORAL | 2 refills | Status: DC

## 2024-07-04 MED ORDER — BUSPIRONE HCL 5 MG PO TABS: 5.0000 mg | ORAL_TABLET | Freq: Two times a day (BID) | ORAL | 1 refills | Status: DC | PRN

## 2024-07-04 NOTE — Progress Notes (Signed)
 Subjective:    Patient ID: Sarah Harvey, female    DOB: [birth year], 28 y.o.   MRN: [id number]  Patient here for  Chief Complaint  Patient presents with   Postpartum Care   Panic Attack   Anxiety    HPI Here for work in appt - f/u postpartum mental health. Had f/u with gyn 06/12/24. Date of delivery 04/19/24 - c-section. Plan f/u pap 08/2025. Some anxiety noted. Treated with zoloft . Seen for acute visit 06/29/24 - diagnosed with sinusitis. Symptoms started around 06/23/24. Treated with augmentin . Reports increased stress. Increased anxiety. Worse over the last several weeks. Denies significant depression. No SI. Not sure if the zoloft  is helping her now.    Past Medical History:  Diagnosis Date   Anxiety    Asthma    Depression    GERD (gastroesophageal reflux disease)    History of migraine    POTS (postural orthostatic tachycardia syndrome)    Seizures (HCC)    Syncope    Past Surgical History:  Procedure Laterality Date   CESAREAN SECTION N/A 04/19/2024   Procedure: CESAREAN DELIVERY;  Surgeon: Leonce Garnette BIRCH, MD;  Location: [facility] ORS;  Service: Obstetrics;  Laterality: N/A;   WISDOM TOOTH EXTRACTION Bilateral 2024   WRIST SURGERY     Family History  Problem Relation Age of Onset   Arthritis Mother    Anxiety disorder Mother    Miscarriages / Stillbirths Mother    Alcohol abuse Father    Depression Father    Drug abuse Father    Diabetes Father    Hyperlipidemia Father    Hypertension Father    Arthritis Maternal Grandmother    Depression Maternal Grandmother    Hyperlipidemia Maternal Grandmother    Hypertension Maternal Grandmother    Learning disabilities Maternal Grandmother    Cancer Maternal Grandfather    Heart disease Maternal Grandfather    Hyperlipidemia Maternal Grandfather    Cancer Paternal Grandfather    Social History   Socioeconomic History   Marital status: Married    Spouse name: Not on file   Number of children: Not on file    Years of education: Not on file   Highest education level: Associate degree: academic program  Occupational History   Not on file  Tobacco Use   Smoking status: Never   Smokeless tobacco: Never  Vaping Use   Vaping status: Never Used  Substance and Sexual Activity   Alcohol use: Yes    Comment: every now and then   Drug use: No   Sexual activity: Yes    Birth control/protection: Condom, Pill    Comment: planning on birthcontrol pills after delivery  Other Topics Concern   Not on file  Social History Narrative   ** Merged History Encounter **       ** Merged History Encounter **       Social Drivers of Corporate Investment Banker Strain: Low Risk  (06/30/2024)   Overall Financial Resource Strain (CARDIA)    Difficulty of Paying Living Expenses: Not very hard  Food Insecurity: No Food Insecurity (06/30/2024)   Hunger Vital Sign    Worried About Running Out of Food in the Last Year: Never true    Ran Out of Food in the Last Year: Never true  Transportation Needs: No Transportation Needs (06/30/2024)   PRAPARE - Administrator, Civil Service (Medical): No    Lack of Transportation (Non-Medical): No  Physical Activity: Insufficiently Active (06/30/2024)  Exercise Vital Sign    Days of Exercise per Week: 3 days    Minutes of Exercise per Session: 30 min  Stress: Stress Concern Present (06/30/2024)   [employer] of Occupational Health - Occupational Stress Questionnaire    Feeling of Stress: Rather much  Social Connections: Moderately Isolated (06/30/2024)   Social Connection and Isolation Panel    Frequency of Communication with Friends and Family: Three times a week    Frequency of Social Gatherings with Friends and Family: More than three times a week    Attends Religious Services: Never    Database Administrator or Organizations: No    Attends Engineer, Structural: Not on file    Marital Status: Married     Review of Systems   Constitutional:  Negative for appetite change and unexpected weight change.  HENT:  Negative for congestion and sinus pressure.   Respiratory:  Negative for cough, chest tightness and shortness of breath.   Cardiovascular:  Negative for chest pain, palpitations and leg swelling.  Gastrointestinal:  Negative for abdominal pain, diarrhea, nausea and vomiting.  Genitourinary:  Negative for difficulty urinating and dysuria.  Musculoskeletal:  Negative for joint swelling and myalgias.  Skin:  Negative for color change and rash.  Neurological:  Negative for dizziness and headaches.  Psychiatric/Behavioral:  Negative for agitation and dysphoric mood.        Increased stress/anxiety as outlined.        Objective:     BP 110/70 (Cuff Size: Normal)   Pulse 76   Temp 98.1 F (36.7 C) (Oral)   Ht 5' 4 (1.626 m)   Wt 270 lb 12.8 oz (122.8 kg)   SpO2 99%   Breastfeeding No   BMI 46.48 kg/m  Wt Readings from Last 3 Encounters:  07/04/24 270 lb 12.8 oz (122.8 kg)  04/30/24 268 lb 15.4 oz (122 kg)  04/19/24 269 lb (122 kg)    Physical Exam Vitals reviewed.  Constitutional:      General: She is not in acute distress.    Appearance: Normal appearance.  HENT:     Head: Normocephalic and atraumatic.     Right Ear: External ear normal.     Left Ear: External ear normal.  Eyes:     General: No scleral icterus.       Right eye: No discharge.        Left eye: No discharge.     Conjunctiva/sclera: Conjunctivae normal.  Neck:     Thyroid : No thyromegaly.  Cardiovascular:     Rate and Rhythm: Normal rate and regular rhythm.  Pulmonary:     Effort: No respiratory distress.     Breath sounds: Normal breath sounds. No wheezing.  Abdominal:     General: Bowel sounds are normal.     Palpations: Abdomen is soft.     Tenderness: There is no abdominal tenderness.  Musculoskeletal:        General: No swelling or tenderness.     Cervical back: Neck supple. No tenderness.  Lymphadenopathy:      Cervical: No cervical adenopathy.  Skin:    Findings: No erythema or rash.  Neurological:     Mental Status: She is alert.  Psychiatric:        Mood and Affect: Mood normal.        Behavior: Behavior normal.         Outpatient Encounter Medications as of 07/04/2024  Medication Sig   amoxicillin -clavulanate (AUGMENTIN ) 875-125  MG tablet Take 1 tablet by mouth 2 (two) times daily.   Blood Pressure KIT 1 Units by Does not apply route 2 (two) times daily.   busPIRone (BUSPAR) 5 MG tablet Take 1 tablet (5 mg total) by mouth 2 (two) times daily as needed.   Norgestim-Eth Estrad Triphasic (NORGESTIMATE-ETHINYL ESTRADIOL TRIPHASIC) 0.18/0.215/0.25 MG-25 MCG tab Take 1 tablet by mouth daily.   sertraline  (ZOLOFT ) 100 MG tablet Take 1 tablet (100 mg total) by mouth daily.   [DISCONTINUED] sertraline  (ZOLOFT ) 50 MG tablet Take 50 mg by mouth in the morning.   No facility-administered encounter medications on file as of 07/04/2024.     Lab Results  Component Value Date   WBC 7.9 07/04/2024   HGB 12.5 07/04/2024   HCT 38.6 07/04/2024   PLT 435.0 (H) 07/04/2024   GLUCOSE 86 07/04/2024   CHOL 153 07/04/2024   TRIG 146.0 07/04/2024   HDL 56.50 07/04/2024   LDLCALC 67 07/04/2024   ALT 12 07/04/2024   AST 11 07/04/2024   NA 140 07/04/2024   K 4.2 07/04/2024   CL 105 07/04/2024   CREATININE 0.77 07/04/2024   BUN 15 07/04/2024   CO2 25 07/04/2024   TSH 1.10 07/04/2024    US  ABDOMEN LIMITED RUQ (LIVER/GB) Result Date: 04/30/2024 CLINICAL DATA:  Right upper quadrant abdominal pain, nausea, vomiting and diarrhea. EXAM: ULTRASOUND ABDOMEN LIMITED RIGHT UPPER QUADRANT COMPARISON:  None Available. FINDINGS: Gallbladder: No gallstones or wall thickening visualized. No sonographic Murphy sign noted by sonographer. Common bile duct: Diameter: It is 1.4 mm Liver: No focal lesion identified. Minimally heterogeneous parenchymal echogenicity. Portal vein is patent on color Doppler imaging with  normal direction of blood flow towards the liver. Other: None. IMPRESSION: No acute abnormality. Electronically Signed   By: Elspeth Bathe M.D.   On: 04/30/2024 10:56       Assessment & Plan:  History of gestational hypertension Assessment & Plan: Blood pressure doing well on today's check. Follow.   Orders: -     Basic metabolic panel with GFR -     Hepatic function panel -     Lipid panel  Thrombocytosis Assessment & Plan: Platelet count elevated - recent cbc. Recheck cbc today.   Orders: -     CBC with Differential/Platelet -     Hepatic function panel  Stress Assessment & Plan: Increased stress and anxiety as outlined. Discussed treatment options. On zoloft  50mg  q day. Will increase zoloft  to 100mg  q day. Also buspar 5mg  bid prn. Discussed psych referral. Agreeable. Follow. Call with update.   Orders: -     TSH -     Ambulatory referral to Psychiatry  Other orders -     Sertraline  HCl; Take 1 tablet (100 mg total) by mouth daily.  Dispense: 30 tablet; Refill: 2 -     busPIRone HCl; Take 1 tablet (5 mg total) by mouth 2 (two) times daily as needed.  Dispense: 60 tablet; Refill: 1     Allena Hamilton, MD

## 2024-07-07 NOTE — Assessment & Plan Note (Signed)
 Platelet count elevated - recent cbc. Recheck cbc today.

## 2024-07-07 NOTE — Assessment & Plan Note (Signed)
 Increased stress and anxiety as outlined. Discussed treatment options. On zoloft  50mg  q day. Will increase zoloft  to 100mg  q day. Also buspar 5mg  bid prn. Discussed psych referral. Agreeable. Follow. Call with update.

## 2024-07-07 NOTE — Assessment & Plan Note (Signed)
 Blood pressure doing well on today's check. Follow.

## 2024-07-09 ENCOUNTER — Ambulatory Visit: Payer: Self-pay | Admitting: Internal Medicine

## 2024-07-09 DIAGNOSIS — Z8759 Personal history of other complications of pregnancy, childbirth and the puerperium: Secondary | ICD-10-CM

## 2024-07-09 DIAGNOSIS — D75839 Thrombocytosis, unspecified: Secondary | ICD-10-CM

## 2024-07-12 ENCOUNTER — Other Ambulatory Visit: Payer: Self-pay

## 2024-07-12 DIAGNOSIS — D75839 Thrombocytosis, unspecified: Secondary | ICD-10-CM

## 2024-07-12 DIAGNOSIS — R748 Abnormal levels of other serum enzymes: Secondary | ICD-10-CM

## 2024-07-14 ENCOUNTER — Telehealth: Admitting: Physician Assistant

## 2024-07-14 DIAGNOSIS — R059 Cough, unspecified: Secondary | ICD-10-CM | POA: Diagnosis not present

## 2024-07-15 MED ORDER — BENZONATATE 200 MG PO CAPS: 200.0000 mg | ORAL_CAPSULE | Freq: Two times a day (BID) | ORAL | 0 refills | Status: DC | PRN

## 2024-07-15 MED ORDER — ALBUTEROL SULFATE HFA 108 (90 BASE) MCG/ACT IN AERS: 2.0000 | INHALATION_SPRAY | Freq: Four times a day (QID) | RESPIRATORY_TRACT | 2 refills | Status: AC | PRN

## 2024-07-15 MED ORDER — PREDNISONE 20 MG PO TABS: 40.0000 mg | ORAL_TABLET | Freq: Every day | ORAL | 0 refills | Status: AC

## 2024-07-15 NOTE — Progress Notes (Signed)
 We are sorry that you are not feeling well.  Here is how we plan to help!  Based on your presentation I believe you most likely have A cough due to a virus.  This is called viral bronchitis and is best treated by rest, plenty of fluids and control of the cough.  You may use Ibuprofen  or Tylenol  as directed to help your symptoms.     In addition you may use A prescription cough medication called Tessalon  Perles 100mg . You may take 1-2 capsules every 8 hours as needed for your cough.  Short course of prednisone  and your albuterol  inhaler for asthma.   From your responses in the eVisit questionnaire you describe inflammation in the upper respiratory tract which is causing a significant cough.  This is commonly called Bronchitis and has four common causes:   Allergies Viral Infections Acid Reflux Bacterial Infection Allergies, viruses and acid reflux are treated by controlling symptoms or eliminating the cause. An example might be a cough caused by taking certain blood pressure medications. You stop the cough by changing the medication. Another example might be a cough caused by acid reflux. Controlling the reflux helps control the cough.  USE OF BRONCHODILATOR (RESCUE) INHALERS: There is a risk from using your bronchodilator too frequently.  The risk is that over-reliance on a medication which only relaxes the muscles surrounding the breathing tubes can reduce the effectiveness of medications prescribed to reduce swelling and congestion of the tubes themselves.  Although you feel brief relief from the bronchodilator inhaler, your asthma may actually be worsening with the tubes becoming more swollen and filled with mucus.  This can delay other crucial treatments, such as oral steroid medications. If you need to use a bronchodilator inhaler daily, several times per day, you should discuss this with your provider.  There are probably better treatments that could be used to keep your asthma under control.      HOME CARE Only take medications as instructed by your medical team. Complete the entire course of an antibiotic. Drink plenty of fluids and get plenty of rest. Avoid close contacts especially the very young and the elderly Cover your mouth if you cough or cough into your sleeve. Always remember to wash your hands A steam or ultrasonic humidifier can help congestion.   GET HELP RIGHT AWAY IF: You develop worsening fever. You become short of breath You cough up blood. Your symptoms persist after you have completed your treatment plan MAKE SURE YOU  Understand these instructions. Will watch your condition. Will get help right away if you are not doing well or get worse.  Your e-visit answers were reviewed by a board certified advanced clinical practitioner to complete your personal care plan.  Depending on the condition, your plan could have included both over the counter or prescription medications. If there is a problem please reply  once you have received a response from your provider. Your safety is important to us .  If you have drug allergies check your prescription carefully.    You can use MyChart to ask questions about today's visit, request a non-urgent call back, or ask for a work or school excuse for 24 hours related to this e-Visit. If it has been greater than 24 hours you will need to follow up with your provider, or enter a new e-Visit to address those concerns. You will get an e-mail in the next two days asking about your experience.  I hope that your e-visit has been valuable  and will speed your recovery. Thank you for using e-visits.   I have spent 5 minutes in review of e-visit questionnaire, review and updating patient chart, medical decision making and response to patient.   Teena Shuck, PA-C

## 2024-07-26 ENCOUNTER — Other Ambulatory Visit: Payer: Self-pay | Admitting: Internal Medicine

## 2024-07-27 NOTE — Telephone Encounter (Signed)
 Rx ok'd for zoloft  and buspar.

## 2024-08-30 ENCOUNTER — Other Ambulatory Visit

## 2024-09-03 ENCOUNTER — Ambulatory Visit (INDEPENDENT_AMBULATORY_CARE_PROVIDER_SITE_OTHER): Admitting: Internal Medicine

## 2024-09-03 ENCOUNTER — Encounter: Payer: Self-pay | Admitting: Internal Medicine

## 2024-09-03 VITALS — BP 98/80 | HR 114 | Ht 64.0 in | Wt 283.2 lb

## 2024-09-03 DIAGNOSIS — F320 Major depressive disorder, single episode, mild: Secondary | ICD-10-CM | POA: Diagnosis not present

## 2024-09-03 DIAGNOSIS — Z8759 Personal history of other complications of pregnancy, childbirth and the puerperium: Secondary | ICD-10-CM

## 2024-09-03 DIAGNOSIS — F439 Reaction to severe stress, unspecified: Secondary | ICD-10-CM | POA: Diagnosis not present

## 2024-09-03 DIAGNOSIS — D75839 Thrombocytosis, unspecified: Secondary | ICD-10-CM

## 2024-09-03 MED ORDER — TIRZEPATIDE-WEIGHT MANAGEMENT 2.5 MG/0.5ML ~~LOC~~ SOLN: 2.5000 mg | SUBCUTANEOUS | 2 refills | Status: AC

## 2024-09-03 MED ORDER — BUSPIRONE HCL 10 MG PO TABS: 10.0000 mg | ORAL_TABLET | Freq: Three times a day (TID) | ORAL | 1 refills | Status: DC

## 2024-09-03 MED ORDER — SERTRALINE HCL 100 MG PO TABS: 100.0000 mg | ORAL_TABLET | Freq: Every day | ORAL | 1 refills | Status: AC

## 2024-09-03 NOTE — Progress Notes (Signed)
 "  Subjective:    Patient ID: Sarah Harvey, female    DOB: 11/01/1995, 28 y.o.   MRN: [id number]  Patient here for  Chief Complaint  Patient presents with   Medical Management of Chronic Issues    HPI Here for a scheduled follow up - follow up regarding increased anxiety. (Post partum). Date of delivery 04/19/24 - c-section. Increased zoloft  to 100mg  last visit. Also referred to psych. Has not been scheduled an appt with psych. Agreeable to see. Feels the medication helps. She is also on buspar . Discussed increasing dose. Agreeable. Tries to stay active. Breathing stable. She is concerned regarding her weight. Discussed diet and exercise. Discussed GLP 1 agonists. She is interested in trying. No plans to become pregnant. No abdominal pain or problems with constipation.    Past Medical History:  Diagnosis Date   Anxiety    Asthma    Depression    GERD (gastroesophageal reflux disease)    History of migraine    POTS (postural orthostatic tachycardia syndrome)    Seizures (HCC)    Syncope    Past Surgical History:  Procedure Laterality Date   CESAREAN SECTION N/A 04/19/2024   Procedure: CESAREAN DELIVERY;  Surgeon: Leonce Garnette BIRCH, MD;  Location: [facility] ORS;  Service: Obstetrics;  Laterality: N/A;   WISDOM TOOTH EXTRACTION Bilateral 2024   WRIST SURGERY     Family History  Problem Relation Age of Onset   Arthritis Mother    Anxiety disorder Mother    Miscarriages / Stillbirths Mother    Alcohol abuse Father    Depression Father    Drug abuse Father    Diabetes Father    Hyperlipidemia Father    Hypertension Father    Arthritis Maternal Grandmother    Depression Maternal Grandmother    Hyperlipidemia Maternal Grandmother    Hypertension Maternal Grandmother    Learning disabilities Maternal Grandmother    Cancer Maternal Grandfather    Heart disease Maternal Grandfather    Hyperlipidemia Maternal Grandfather    Cancer Paternal Grandfather    Social History    Socioeconomic History   Marital status: Married    Spouse name: Not on file   Number of children: Not on file   Years of education: Not on file   Highest education level: Associate degree: academic program  Occupational History   Not on file  Tobacco Use   Smoking status: Never   Smokeless tobacco: Never  Vaping Use   Vaping status: Never Used  Substance and Sexual Activity   Alcohol use: Yes    Comment: every now and then   Drug use: No   Sexual activity: Yes    Birth control/protection: Condom, Pill    Comment: planning on birthcontrol pills after delivery  Other Topics Concern   Not on file  Social History Narrative   ** Merged History Encounter **       ** Merged History Encounter **       Social Drivers of Health   Tobacco Use: Low Risk (09/09/2024)   Patient History    Smoking Tobacco Use: Never    Smokeless Tobacco Use: Never    Passive Exposure: Not on file  Financial Resource Strain: Low Risk (06/30/2024)   Overall Financial Resource Strain (CARDIA)    Difficulty of Paying Living Expenses: Not very hard  Food Insecurity: No Food Insecurity (06/30/2024)   Epic    Worried About Radiation Protection Practitioner of Food in the Last Year: Never true  Ran Out of Food in the Last Year: Never true  Transportation Needs: No Transportation Needs (06/30/2024)   Epic    Lack of Transportation (Medical): No    Lack of Transportation (Non-Medical): No  Physical Activity: Insufficiently Active (06/30/2024)   Exercise Vital Sign    Days of Exercise per Week: 3 days    Minutes of Exercise per Session: 30 min  Stress: Stress Concern Present (06/30/2024)   [employer] of Occupational Health - Occupational Stress Questionnaire    Feeling of Stress: Rather much  Social Connections: Moderately Isolated (06/30/2024)   Social Connection and Isolation Panel    Frequency of Communication with Friends and Family: Three times a week    Frequency of Social Gatherings with Friends and  Family: More than three times a week    Attends Religious Services: Never    Database Administrator or Organizations: No    Attends Engineer, Structural: Not on file    Marital Status: Married  Depression (PHQ2-9): High Risk (09/03/2024)   Depression (PHQ2-9)    PHQ-2 Score: 12  Alcohol Screen: Not on file  Housing: Low Risk (06/30/2024)   Epic    Unable to Pay for Housing in the Last Year: No    Number of Times Moved in the Last Year: 0    Homeless in the Last Year: No  Utilities: Not At Risk (04/19/2024)   Epic    Threatened with loss of utilities: No  Health Literacy: Not on file     Review of Systems  Constitutional:  Negative for appetite change.       Concern regarding weight gain.   HENT:  Negative for congestion and sinus pressure.   Respiratory:  Negative for cough, chest tightness and shortness of breath.   Cardiovascular:  Negative for chest pain, palpitations and leg swelling.  Gastrointestinal:  Negative for abdominal pain, diarrhea, nausea and vomiting.  Genitourinary:  Negative for difficulty urinating and dysuria.  Musculoskeletal:  Negative for joint swelling and myalgias.  Skin:  Negative for color change and rash.  Neurological:  Negative for dizziness and headaches.  Psychiatric/Behavioral:  Negative for agitation.        Increased stress.        Objective:     BP 98/80   Pulse (!) 114   Ht 5' 4 (1.626 m)   Wt 283 lb 3.2 oz (128.5 kg)   SpO2 95%   BMI 48.61 kg/m  Wt Readings from Last 3 Encounters:  09/03/24 283 lb 3.2 oz (128.5 kg)  07/04/24 270 lb 12.8 oz (122.8 kg)  04/30/24 268 lb 15.4 oz (122 kg)    Physical Exam Vitals reviewed.  Constitutional:      General: She is not in acute distress.    Appearance: Normal appearance.  HENT:     Head: Normocephalic and atraumatic.     Right Ear: External ear normal.     Left Ear: External ear normal.     Mouth/Throat:     Pharynx: No oropharyngeal exudate or posterior oropharyngeal  erythema.  Eyes:     General: No scleral icterus.       Right eye: No discharge.        Left eye: No discharge.     Conjunctiva/sclera: Conjunctivae normal.  Neck:     Thyroid : No thyromegaly.  Cardiovascular:     Rate and Rhythm: Normal rate and regular rhythm.  Pulmonary:     Effort: No respiratory distress.  Breath sounds: Normal breath sounds. No wheezing.  Abdominal:     General: Bowel sounds are normal.     Palpations: Abdomen is soft.     Tenderness: There is no abdominal tenderness.  Musculoskeletal:        General: No swelling or tenderness.     Cervical back: Neck supple. No tenderness.  Lymphadenopathy:     Cervical: No cervical adenopathy.  Skin:    Findings: No erythema or rash.  Neurological:     Mental Status: She is alert.  Psychiatric:        Mood and Affect: Mood normal.        Behavior: Behavior normal.         Outpatient Encounter Medications as of 09/03/2024  Medication Sig   albuterol  (VENTOLIN  HFA) 108 (90 Base) MCG/ACT inhaler Inhale 2 puffs into the lungs every 6 (six) hours as needed for wheezing or shortness of breath.   Blood Pressure KIT 1 Units by Does not apply route 2 (two) times daily.   busPIRone  (BUSPAR ) 10 MG tablet Take 1 tablet (10 mg total) by mouth 3 (three) times daily.   Norgestim-Eth Estrad Triphasic (NORGESTIMATE-ETHINYL ESTRADIOL TRIPHASIC) 0.18/0.215/0.25 MG-25 MCG tab Take 1 tablet by mouth daily.   tirzepatide  (ZEPBOUND ) 2.5 MG/0.5ML injection vial Inject 2.5 mg into the skin once a week.   [DISCONTINUED] busPIRone  (BUSPAR ) 5 MG tablet TAKE 1 TABLET BY MOUTH 2 TIMES DAILY AS NEEDED.   sertraline  (ZOLOFT ) 100 MG tablet Take 1 tablet (100 mg total) by mouth daily.   [DISCONTINUED] amoxicillin -clavulanate (AUGMENTIN ) 875-125 MG tablet Take 1 tablet by mouth 2 (two) times daily.   [DISCONTINUED] benzonatate  (TESSALON ) 200 MG capsule Take 1 capsule (200 mg total) by mouth 2 (two) times daily as needed for cough.    [DISCONTINUED] sertraline  (ZOLOFT ) 100 MG tablet TAKE 1 TABLET BY MOUTH EVERY DAY   No facility-administered encounter medications on file as of 09/03/2024.     Lab Results  Component Value Date   WBC 7.9 07/04/2024   HGB 12.5 07/04/2024   HCT 38.6 07/04/2024   PLT 435.0 (H) 07/04/2024   GLUCOSE 86 07/04/2024   CHOL 153 07/04/2024   TRIG 146.0 07/04/2024   HDL 56.50 07/04/2024   LDLCALC 67 07/04/2024   ALT 12 07/04/2024   AST 11 07/04/2024   NA 140 07/04/2024   K 4.2 07/04/2024   CL 105 07/04/2024   CREATININE 0.77 07/04/2024   BUN 15 07/04/2024   CO2 25 07/04/2024   TSH 1.10 07/04/2024    US  ABDOMEN LIMITED RUQ (LIVER/GB) Result Date: 04/30/2024 CLINICAL DATA:  Right upper quadrant abdominal pain, nausea, vomiting and diarrhea. EXAM: ULTRASOUND ABDOMEN LIMITED RIGHT UPPER QUADRANT COMPARISON:  None Available. FINDINGS: Gallbladder: No gallstones or wall thickening visualized. No sonographic Murphy sign noted by sonographer. Common bile duct: Diameter: It is 1.4 mm Liver: No focal lesion identified. Minimally heterogeneous parenchymal echogenicity. Portal vein is patent on color Doppler imaging with normal direction of blood flow towards the liver. Other: None. IMPRESSION: No acute abnormality. Electronically Signed   By: Elspeth Bathe M.D.   On: 04/30/2024 10:56       Assessment & Plan:  Major depressive disorder, single episode, mild Assessment & Plan: Discussed. Overall doing better. Continue zoloft . Buspar  as directed to help with stress/anxiety.    Stress Assessment & Plan: Increased stress and anxiety as outlined. Discussed treatment options. Continue zoloft  100mg  q day. Increase buspar  to 10mg  tid. Follow. Order placed for psych referral  last visit. Will follow up to confirm scheduled. Call with update.   Orders: -     TSH; Future  History of gestational hypertension -     Lipid panel; Future -     Hepatic function panel; Future -     Basic metabolic panel with  GFR; Future  Thrombocytosis Assessment & Plan: Platelet count 438 last check. Follow. Plan for recheck cbc with next labs.   Orders: -     Hepatic function panel; Future  Severe obesity (BMI >= 40) (HCC) Assessment & Plan: Discussed diet and exercise. Discussed weight loss options. She is interested in GLP 1. Discussed possible side effects of the medication. Denies family history of medullary thyroid  cancer, neuroendocrine tumors, etc. No problems with gallbladder or pancreatitis. No plans for pregnancy. Discussed importance of keeping bowels moving and getting adequate protein. Stay hydrated and monitor carb intake. Rx sent in for zepbound  2.5mg  weekly.    Other orders -     Sertraline  HCl; Take 1 tablet (100 mg total) by mouth daily.  Dispense: 90 tablet; Refill: 1 -     busPIRone  HCl; Take 1 tablet (10 mg total) by mouth 3 (three) times daily.  Dispense: 90 tablet; Refill: 1 -     Tirzepatide -Weight Management; Inject 2.5 mg into the skin once a week.  Dispense: 2 mL; Refill: 2     Allena Hamilton, MD "

## 2024-09-09 ENCOUNTER — Encounter: Payer: Self-pay | Admitting: Internal Medicine

## 2024-09-09 NOTE — Assessment & Plan Note (Signed)
 Increased stress and anxiety as outlined. Discussed treatment options. Continue zoloft  100mg  q day. Increase buspar  to 10mg  tid. Follow. Order placed for psych referral last visit. Will follow up to confirm scheduled. Call with update.

## 2024-09-09 NOTE — Assessment & Plan Note (Signed)
 Platelet count 438 last check. Follow. Plan for recheck cbc with next labs.

## 2024-09-09 NOTE — Assessment & Plan Note (Signed)
 Discussed diet and exercise. Discussed weight loss options. She is interested in GLP 1. Discussed possible side effects of the medication. Denies family history of medullary thyroid  cancer, neuroendocrine tumors, etc. No problems with gallbladder or pancreatitis. No plans for pregnancy. Discussed importance of keeping bowels moving and getting adequate protein. Stay hydrated and monitor carb intake. Rx sent in for zepbound  2.5mg  weekly.

## 2024-09-09 NOTE — Assessment & Plan Note (Signed)
 Discussed. Overall doing better. Continue zoloft . Buspar  as directed to help with stress/anxiety.

## 2024-09-25 ENCOUNTER — Other Ambulatory Visit: Payer: Self-pay | Admitting: Internal Medicine
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
## 2024-10-07 ENCOUNTER — Ambulatory Visit: Admitting: Psychiatry

## 2024-10-07 ENCOUNTER — Encounter: Payer: Self-pay | Admitting: Psychiatry

## 2024-10-07 DIAGNOSIS — F4310 Post-traumatic stress disorder, unspecified: Secondary | ICD-10-CM

## 2024-10-07 DIAGNOSIS — F330 Major depressive disorder, recurrent, mild: Secondary | ICD-10-CM | POA: Diagnosis not present

## 2024-10-07 DIAGNOSIS — F411 Generalized anxiety disorder: Secondary | ICD-10-CM

## 2024-10-07 MED ORDER — SERTRALINE HCL 100 MG PO TABS: 150.0000 mg | ORAL_TABLET | Freq: Every day | ORAL | 0 refills | Status: AC

## 2024-10-30 ENCOUNTER — Ambulatory Visit: Admitting: Internal Medicine

## 2024-11-19 ENCOUNTER — Ambulatory Visit: Admitting: Psychiatry
